# Patient Record
Sex: Male | Born: 1935 | Race: Black or African American | Hispanic: No | Marital: Married | Smoking: Former smoker
Health system: Southern US, Community
[De-identification: ages and names within clinical notes are randomized; demographics above are authoritative.]

## PROBLEM LIST (undated history)

## (undated) DIAGNOSIS — E7800 Pure hypercholesterolemia, unspecified: Secondary | ICD-10-CM

## (undated) DIAGNOSIS — C801 Malignant (primary) neoplasm, unspecified: Secondary | ICD-10-CM

## (undated) DIAGNOSIS — F0390 Unspecified dementia without behavioral disturbance: Secondary | ICD-10-CM

## (undated) DIAGNOSIS — M1990 Unspecified osteoarthritis, unspecified site: Secondary | ICD-10-CM

## (undated) DIAGNOSIS — K8020 Calculus of gallbladder without cholecystitis without obstruction: Secondary | ICD-10-CM

## (undated) DIAGNOSIS — N289 Disorder of kidney and ureter, unspecified: Secondary | ICD-10-CM

## (undated) DIAGNOSIS — I10 Essential (primary) hypertension: Secondary | ICD-10-CM

## (undated) HISTORY — PX: CHOLECYSTECTOMY: SHX55

## (undated) HISTORY — PX: FRACTURE SURGERY: SHX138

---

## 1993-12-12 DIAGNOSIS — C801 Malignant (primary) neoplasm, unspecified: Secondary | ICD-10-CM

## 1993-12-12 HISTORY — DX: Malignant (primary) neoplasm, unspecified: C80.1

## 1994-12-12 HISTORY — PX: PROSTATE SURGERY: SHX751

## 1998-04-21 ENCOUNTER — Ambulatory Visit (HOSPITAL_COMMUNITY): Admission: RE | Admit: 1998-04-21 | Discharge: 1998-04-22 | Payer: Self-pay | Admitting: Urology

## 1998-10-30 ENCOUNTER — Ambulatory Visit (HOSPITAL_COMMUNITY): Admission: RE | Admit: 1998-10-30 | Discharge: 1998-10-30 | Payer: Self-pay | Admitting: Urology

## 2002-01-02 ENCOUNTER — Encounter: Payer: Self-pay | Admitting: Family Medicine

## 2002-01-02 ENCOUNTER — Ambulatory Visit (HOSPITAL_COMMUNITY): Admission: RE | Admit: 2002-01-02 | Discharge: 2002-01-02 | Payer: Self-pay | Admitting: Family Medicine

## 2002-11-11 ENCOUNTER — Ambulatory Visit (HOSPITAL_COMMUNITY): Admission: RE | Admit: 2002-11-11 | Discharge: 2002-11-11 | Payer: Self-pay | Admitting: Ophthalmology

## 2003-11-10 ENCOUNTER — Ambulatory Visit (HOSPITAL_COMMUNITY): Admission: RE | Admit: 2003-11-10 | Discharge: 2003-11-10 | Payer: Self-pay | Admitting: Urology

## 2003-11-27 ENCOUNTER — Observation Stay (HOSPITAL_COMMUNITY): Admission: RE | Admit: 2003-11-27 | Discharge: 2003-11-28 | Payer: Self-pay | Admitting: Urology

## 2004-06-29 ENCOUNTER — Ambulatory Visit (HOSPITAL_COMMUNITY): Admission: RE | Admit: 2004-06-29 | Discharge: 2004-06-29 | Payer: Self-pay | Admitting: Urology

## 2005-05-20 ENCOUNTER — Ambulatory Visit (HOSPITAL_COMMUNITY): Admission: RE | Admit: 2005-05-20 | Discharge: 2005-05-20 | Payer: Self-pay | Admitting: Urology

## 2005-07-18 ENCOUNTER — Ambulatory Visit (HOSPITAL_COMMUNITY): Admission: RE | Admit: 2005-07-18 | Discharge: 2005-07-18 | Payer: Self-pay | Admitting: Internal Medicine

## 2005-07-18 ENCOUNTER — Encounter (INDEPENDENT_AMBULATORY_CARE_PROVIDER_SITE_OTHER): Payer: Self-pay | Admitting: Urology

## 2006-03-29 ENCOUNTER — Ambulatory Visit (HOSPITAL_COMMUNITY): Admission: RE | Admit: 2006-03-29 | Discharge: 2006-03-29 | Payer: Self-pay | Admitting: Internal Medicine

## 2006-10-12 ENCOUNTER — Ambulatory Visit: Payer: Self-pay | Admitting: Orthopedic Surgery

## 2006-10-17 ENCOUNTER — Ambulatory Visit (HOSPITAL_COMMUNITY): Admission: RE | Admit: 2006-10-17 | Discharge: 2006-10-17 | Payer: Self-pay | Admitting: Internal Medicine

## 2006-10-17 ENCOUNTER — Ambulatory Visit: Payer: Self-pay | Admitting: Cardiology

## 2007-01-29 ENCOUNTER — Ambulatory Visit (HOSPITAL_COMMUNITY): Admission: RE | Admit: 2007-01-29 | Discharge: 2007-01-29 | Payer: Self-pay | Admitting: Ophthalmology

## 2007-05-03 ENCOUNTER — Ambulatory Visit (HOSPITAL_COMMUNITY): Admission: RE | Admit: 2007-05-03 | Discharge: 2007-05-03 | Payer: Self-pay | Admitting: Internal Medicine

## 2007-05-11 ENCOUNTER — Ambulatory Visit (HOSPITAL_COMMUNITY): Admission: RE | Admit: 2007-05-11 | Discharge: 2007-05-11 | Payer: Self-pay | Admitting: Urology

## 2007-11-20 ENCOUNTER — Ambulatory Visit (HOSPITAL_COMMUNITY): Admission: RE | Admit: 2007-11-20 | Discharge: 2007-11-20 | Payer: Self-pay | Admitting: Urology

## 2007-11-30 ENCOUNTER — Ambulatory Visit (HOSPITAL_COMMUNITY): Admission: RE | Admit: 2007-11-30 | Discharge: 2007-11-30 | Payer: Self-pay | Admitting: Urology

## 2009-05-21 ENCOUNTER — Ambulatory Visit (HOSPITAL_COMMUNITY): Admission: RE | Admit: 2009-05-21 | Discharge: 2009-05-21 | Payer: Self-pay | Admitting: Urology

## 2010-05-17 ENCOUNTER — Ambulatory Visit (HOSPITAL_COMMUNITY): Admission: RE | Admit: 2010-05-17 | Discharge: 2010-05-17 | Payer: Self-pay | Admitting: Urology

## 2011-04-29 NOTE — H&P (Signed)
NAME:  Walter Goodwin                          ACCOUNT NO.:  [id number]   MEDICAL RECORD NO.:  [id number]                  PATIENT TYPE:   LOCATION:                                       FACILITY:  [facility]   PHYSICIAN:  Dennie Maizes, M.D.                DATE OF BIRTH:  05/23/1917   DATE OF ADMISSION:  11/27/2003  DATE OF DISCHARGE:                                HISTORY & PHYSICAL   CHIEF COMPLAINT:  Dysuria, urinary incontinence, carcinoma of prostate,  history of urinary tract infection.   HISTORY OF PRESENT ILLNESS:  This 77 year old male was referred to me by  Annia Friendly. Loleta Chance, M.D.  He was diagnosed to have carcinoma of the prostate in  1995.  He was treated with brachytherapy at that time.  The patient had  voiding difficulty after that and he underwent transurethral resection of  the prostate in 1996 by Lindaann Slough, M.D.  The patient continued to have  hematuria and urinary symptoms.  I saw him in 1998.  Evaluation revealed  multiple bladder calculi as well as prostatic calcifications.  The patient  underwent litholapaxy and transurethral resection of bladder neck at that  time.  The patient has undergone another procedure by Dr. Brunilda Payor a few years  ago.  He was seen in the office with the complaints of dysuria, urinary  frequency and urinary incontinence.  He has been using a Cunningham clamp  for urinary incontinence.  Urine culture and sensitivity revealed growth of  more than 100,000 colonies of E. coli and he was treated with amoxicillin.  X-ray of the KUB area revealed 9 mm size calculus impacted in the proximal  urethra.  The patient is brought to the day hospital today for cystoscopy  and litholapaxy.   PAST MEDICAL HISTORY:  1. History of cholelithiasis, status post right pyelolithotomy in 1981 at     [place].  2. Status post laparoscopic cholecystectomy.  3. Status post brachytherapy for carcinoma of the prostate in 1995.  4. Status post transurethral resection  of the prostate in 1996.  5. Status post litholapaxy and transurethral resection of bladder neck in     1998.  6. Patient has hyperlipidemia.   MEDICATIONS:  Lopid.   ALLERGIES:  None.   PHYSICAL EXAMINATION:  HEENT:  Normal.  NECK:  No masses.  LUNGS:  Clear to auscultation.  HEART:  Regular rate and rhythm, no murmurs.  ABDOMEN:  Soft, no palpable or frank mass.  No CVA tenderness.  Bladder not  palpable.  GENITOURINARY:  Penis normal.  Testes are normal.  RECTAL:  Small, firm prostate.   IMPRESSIONS:  1. Prostatic calculus.  2. Urinary incontinence.  3. History of urinary tract infection.  4. Carcinoma of the prostate.  5. Status post brachytherapy.   PLAN:  Cystoscopy and litholapaxy of the prostatic calculus under anesthesia  in the hospital.   I have discussed with  the patient and his family regarding the diagnosis,  alternative treatments, operative details, outcome, possible risks and  complications and he has agreed for the procedure to be done.     ___________________________________________                                         Dennie Maizes, M.D.   SK/MEDQ  D:  11/26/2003  T:  11/27/2003  Job:  [id number]   cc:   Annia Friendly. Loleta Chance, M.D.  [address]  [place]  [place] [address]  Fax: [phone]

## 2011-04-29 NOTE — Consult Note (Signed)
NAME:  Walter Goodwin, Walter Goodwin                          ACCOUNT NO.:  [id number]   MEDICAL RECORD NO.:  [id number]                   PATIENT TYPE:  OBV   LOCATION:  A306                                 FACILITY:  [facility]   PHYSICIAN:  Annia Friendly. Loleta Chance, M.D.                DATE OF BIRTH:  Jun 21, 1917   DATE OF CONSULTATION:  11/27/2003  DATE OF DISCHARGE:                                   CONSULTATION   REFERRING PHYSICIAN:  Dr. Dennie Maizes.   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  The patient was admitted by Dr. Rito Ehrlich, urologist, on  November 27, 2003 for cystoscopy and litholapaxy for prostatic calculus  under anesthesia at [facility].  History is positive for complaint  of dysuria, increased frequency of urination and urinary incontinence.  He  denies fever, chills, back pain, nausea and vomiting.  Medical history is  significant for history of kidney stones.  X-ray revealed a 9-mm-size  calculus impacted in the proximal urethra.  Medical history is also positive  for hyperlipidemia, hypertension, prostate cancer and osteoarthritis of  knees.  Medical history is negative for tuberculosis, sickle cell, asthma or  seizure disorder.   PRESCRIBED MEDICATIONS:  1. Lipitor 10 mg p.o. every day.  2. DynaCirc CR 5 mg p.o. every day.  3. Mobic 7.5 mg p.o. every day.   ALLERGIES:  The patient is not allergic to any known medication.   PAST MEDICAL HISTORY:  Past medical history is positive for:  1. Hospitalization in 1981 at [facility] for removal of left     renal stone;  2. Cholecystectomy in 1997.  3. Transurethral resection of the prostate in 1996.  4. The patient also experienced treatment with I-125 seed radioactive     implants for prostate cancer in [place], [place].   REVIEW OF SYSTEMS:  Review of systems is positive  for chronic bilateral knee  pain, chronic urinary dribbling, episodic pain with urination, episodic  gross hematuria, etc.  Review of systems is negative for epistaxis, bleeding  gums, dysphagia, hemoptysis, wheezing, shortness of breath, weight loss,  unexplained fever, night sweats, edema of legs, diarrhea, etc.   FAMILY HISTORY:  Family history revealed mother deceased at age 42 secondary  to cancer, unknown type; father deceased at age 5 secondary to complication  of Alzheimer's disease; two brother deceased in their 98s secondary to  complications of diabetes; two sisters living, age 29, health unknown, and  14, health unknown; one son living at age 40 in good health and one son  deceased at age 60, cause unknown.   HABITS:  Habits are positive for cigarette smoking (less than 1 pack per  day, starting as an adolescent) and positive for former use of ethanol x20  years).   PHYSICAL  EXAMINATION:  VITALS:  Temperature 100.0, pulse 55, respirations  18, blood pressure 120/63.  GENERAL:  General appearance revealed an elderly, medium-framed, medium-  height black male in no apparent respiratory distress.  HEENT:  Head:  Normocephalic.  Ears:  Normal auricles.  Eyes:  Lids negative  for ptosis.  Sclerae are white.  Positive bilateral corneal implants.  Extraocular movements intact.  Nose:  Negative for discharge.  Mouth:  Positive for a few poor teeth at top, no teeth at bottom, no bleeding gum,  no oral lesions.  Posterior pharynx benign.  NECK:  Neck negative for adenopathy or thyromegaly.  LUNGS:  Lungs clear.  HEART:  Audible S1 and S2 without murmur, regular rate and rhythm.  ABDOMEN:  Abdomen obese, hyperactive bowel sounds, soft, nontender in all  four quadrants.  No palpable mass.  No organomegaly.  GENITALIA:  Penis uncircumcised.  Urethral stoma positive for indwelling  Foley catheter.  Scrotum:  Palpable testicles without nodule or tenderness.  RECTAL:  Deferred.   EXTREMITIES:  Lower:  No tibial edema.  Palpable femoral artery and dorsalis  pedis bilaterally.  Knees positive for crepitus, stiffness and tenderness  with flexion and extension bilaterally.  NEUROLOGIC:  Alert and oriented to person, place and time.  Cranial nerves  II-XII appeared intact.   LABORATORIES:  White count 6.2, hemoglobin 14.1, hematocrit 42.6, platelets  259,000; sodium 140, potassium 4.5, chloride 108, CO2 28, glucose 96, BUN  18, creatinine 1.0, calcium 10.0 on November 25, 2003.   IMPRESSION:  Primary prostatic calculus -- status post cystoscopy and  litholapaxy.   SECONDARY DIAGNOSES:  1. Hypertension.  2. Hyperlipidemia.  3. Osteoarthritis.  4. Prostate cancer.   PLAN:  1. IV fluids.  2. IV antibiotics.  3. DynaCirc 5 mg p.o. every day.  4. Analgesia for pain.  5. Watch urine output and intake.  6. Oxygen at 2 L per minute via nasal cannula.  7. Diet -- 4 g sodium.  8. Activity as tolerated.  9. Lipitor 10 mg p.o. every day.  10.      Repeat CBC and MET-7 in a.m.      ___________________________________________                                            Annia Friendly. Loleta Chance, M.D.   Levonne Hubert  D:  11/27/2003  T:  11/28/2003  Job:  [id number]

## 2011-04-29 NOTE — Procedures (Signed)
NAME:  MAYKEL, REITTER                ACCOUNT NO.:  [id number]   MEDICAL RECORD NO.:  [id number]          PATIENT TYPE:  OUT   LOCATION:  RAD                           FACILITY:  [facility]   PHYSICIAN:  Gerrit Friends. Dietrich Pates, MD, FACCDATE OF BIRTH:  1917/02/03   DATE OF PROCEDURE:  10/17/2006  DATE OF DISCHARGE:                                  ECHOCARDIOGRAM   CLINICAL DATA:  An 77 year old gentleman with hypertension.  M-mode aorta  3.7, left atrium 4.4, septum 1.7, posterior wall 1.6, LV diastole 5.0, LV  systole 3.4.   1. Technically suboptimal but adequate echocardiographic study.  2. Mild left and right atrial enlargement.  3. Normal right ventricular size and function; mild RVH.  4. Minimally sclerotic trileaflet aortic valve.  5. Normal mitral, tricuspid and pulmonic valves; mild calcification of the      common annulus.  6. Trivial mitral regurgitation.  7. Normal proximal pulmonary artery.  8. Physiologic pericardial effusion.  9. Normal IVC.  10.Left ventricular size at the upper limit of normal; mild to moderate      hypertrophy; normal regional and global function.      Gerrit Friends. Dietrich Pates, MD, [facility]  Electronically Signed     RMR/MEDQ  D:  10/18/2006  T:  10/18/2006  Job:  [id number]

## 2011-04-29 NOTE — H&P (Signed)
NAME:  Walter Goodwin, Walter Goodwin                ACCOUNT NO.:  [id number]   MEDICAL RECORD NO.:  [id number]          PATIENT TYPE:  AMB   LOCATION:  DAY                           FACILITY:  [facility]   PHYSICIAN:  Dennie Maizes, M.D.   DATE OF BIRTH:  Aug 25, 1917   DATE OF ADMISSION:  07/18/2005  DATE OF DISCHARGE:  LH                                HISTORY & PHYSICAL   CHIEF COMPLAINT:  Recurrent urinary tract infection, voiding difficulty,  ureteral stricture, carcinoma of the prostate.   HISTORY OF PRESENT ILLNESS:  This is an 77 year old male who was diagnosed  with a carcinoma of the prostate in 1995.  He was treated with brachytherapy  at [facility] at that time.  He developed voiding difficulty and he has  undergone transurethral resection of prostate in 1996.  The patient has been  evaluated by me in 2004, with bladder calculus and posterior urethral  stricture.  He has undergone litholapaxy and transurethral incision of the  bladder neck as well as posterior urethral stricture.  He had been doing  well until recently.  He developed left epididymitis and a secondary urinary  tract infections recently.  He also has voiding difficulty and severe degree  of urinary incontinence.  He has been using Cunningham clamp for the urinary  incontinence.  He had been treated with appropriate antibiotics for urinary  tract infection.  He is brought to the [facility] today for  circumcision, cystoscopy, and visual internal urethrotomy.  He has a severe  degree of phimosis.   PAST MEDICAL HISTORY:  1.  History of urolithiasis, status post right pyelolithotomy in 1981 at      [place].  2.  Status post laparoscopic cholecystectomy.  3.  Status post brachytherapy for carcinoma of the prostate in 1995.  4.  Status post TURP in 1996.  5.  Status post litholapaxy and transurethral resection of bladder neck in      1998.  6.  Status post cystoscopy, transurethral resection of urethral stricture      and  bladder neck stenosis, litholapaxy of bladder calculus in 2004.   MEDICATIONS:  Lopid.   ALLERGIES:  None.   PHYSICAL EXAMINATION:  HEENT:  Normal.  NECK:  No masses.  LUNGS:  Clear to auscultation.  HEART:  Regular rate and rhythm, no murmurs.  ABDOMEN:  Soft, no palpable flank masses, CVA tenderness.  Bladder is not  palpable.  GENITOURINARY:  Phimosis is noted.  Testes are normal.  The patient has  severe urinary incontinence and uses a Cunningham clamp.  RECTAL:  Small firm prostate.   IMPRESSION:  1.  Recurrent urinary tract infections.  2.  Phimosis.  3.  Ureteral stricture.  4.  Carcinoma of prostate, status post brachytherapy.   PLAN:  Circumcision, cystoscopy, and visual urethrotomy under anesthesia in  the hospital.  I have discussed with the patient and his family regarding  the diagnosis, operative details, outcome, alternate treatments, possible  risks and complications, and he has agreed for the procedure to be done.  He  will need postoperative catheter drainage for  a week.       SK/MEDQ  D:  07/17/2005  T:  07/17/2005  Job:  [id number]   cc:   Annia Friendly. Loleta Chance, MD  [address]  [place]  [place] [address]  Fax: [phone]

## 2011-04-29 NOTE — Op Note (Signed)
NAME:  Walter Goodwin, Walter Goodwin                          ACCOUNT NO.:  [id number]   MEDICAL RECORD NO.:  [id number]                   PATIENT TYPE:  OBV   LOCATION:  A306                                 FACILITY:  [facility]   PHYSICIAN:  Dennie Maizes, M.D.                DATE OF BIRTH:  07-Sep-1917   DATE OF PROCEDURE:  11/27/2003  DATE OF DISCHARGE:                                 OPERATIVE REPORT   PREOPERATIVE DIAGNOSES:  1. Urinary tract infection.  2. Urethral calculus.  3. Carcinoma of the prostate.   POSTOPERATIVE DIAGNOSES:  1. Ureteral stricture and bladder neck stenosis.  2. Urethral calculus.  3. Carcinoma of the prostate.   PROCEDURE:  Cystoscopy, transurethral incision of urethral stricture and  bladder neck stenosis, litholapaxy of bladder calculus (1.5 cm).   ANESTHESIA:  Spinal.   SURGEON:  Dennie Maizes, M.D.   COMPLICATIONS:  None.   INDICATIONS FOR PROCEDURE:  This is an 77 year old male who had carcinoma of  the prostate and he had been treated with brachytherapy.  He developed a  urinary tract infection recently as well as voiding difficulty and urinary  incontinence.  His x-rays revealed a urethral calculus.  The patient was  brought to the OR today for further evaluation with cystoscopy and  litholapaxy of the urethral calculus.   DESCRIPTION OF PROCEDURE:  Spinal anesthesia was induced, and the patient  was placed on the OR table in the dorsal lithotomy position.  The lower  abdomen and genitalia were prepped and draped in a sterile fashion.   Cystoscopy was done with a 23 [place] scope.  The anterior urethra was  normal.  There was a dense prostatic urethral stricture.  The visual  urethrotome was then introduced into the urethra.  I tried to incise the  prostatic urethral stricture at the 12 o'clock position.  The stricture was  too dense to be incised by the cold knife.  The resectoscope was then  inserted, and incision of the urethral stricture was done at  12 o'clock by a  Collings knife electrode.  The patient also had a severe degree of bladder  neck stenosis which was incised at the 3 and 9 o'clock positions with the  electrode knife.  There was a 1.5-cm stone impacted in the prostatic  urethral which was pushed into the bladder.  The appearance of the bladder  was normal.  The resectoscope was then introduced into the bladder.  With  withdrawing the resectoscope further, incision of the bladder neck was done.  The resectoscope was then removed.  Cystoscopy was repeated with the 23  French sheath.  The stone was then crushed with the manual lithotrite.  Irrigation of the bladder was done with an Ellik evacuator, and all of the  stone fragments were removed.  Examination of the bladder at this time was  normal.  The instruments were removed.  A 20 [place]  Foley catheter was  inserted into the bladder, and the urine was clear.  Estimated blood loss  was minimal.   The patient was transferred to the PACU in satisfactory condition.      ___________________________________________                                            Dennie Maizes, M.D.   SK/MEDQ  D:  11/27/2003  T:  11/28/2003  Job:  [id number]   cc:   Annia Friendly. Loleta Chance, M.D.  [address]  [place]  [place] [address]  Fax: [phone]

## 2011-04-29 NOTE — Op Note (Signed)
NAME:  GREGOR, DERSHEM                ACCOUNT NO.:  [id number]   MEDICAL RECORD NO.:  [id number]          PATIENT TYPE:  AMB   LOCATION:  DAY                           [facility]   PHYSICIAN:  Dennie Maizes, M.D.   DATE OF BIRTH:  1917-10-28   DATE OF PROCEDURE:  07/18/2005  DATE OF DISCHARGE:                                 OPERATIVE REPORT   PREOPERATIVE DIAGNOSES:  Urethral stricture, recurrent urinary tract  infections, carcinoma of prostate, phimosis.   POSTOPERATIVE DIAGNOSIS:  Urethral stricture, recurrent urinary tract  infections, carcinoma of prostate, phimosis.   OPERATIVE PROCEDURE:  Cystoscopy, visual internal urethrotomy and  circumcision.   ANESTHESIA:  Spinal.   SURGEON:  Dr. Rito Ehrlich.   COMPLICATIONS:  None.   ESTIMATED BLOOD LOSS:  Minimal.   DRAINS:  20-French Silastic Foley catheter in the bladder.   INDICATIONS FOR PROCEDURE:  This 77 year old male with carcinoma of prostate  has undergone brachytherapy. After the brachytherapy, he has undergone  transurethral resection of prostate as well as bladder neck in the past. He  was recently evaluated for recurrent urinary tract infections. He had  phimosis and ureteral stricture. He was brought to the OR today for  cystoscopy, visual internal urethrotomy and circumcision.   DESCRIPTION OF PROCEDURE:  Spinal anesthesia was induced, and the patient  was placed on the OR table in the supine position. The lower abdomen and  genitalia were prepped and draped in a sterile fashion. The patient had a  tight phimosis. The foreskin was then clamped at 6 and 12 o'clock positions  with straight hemostats. Dorsal and ventral slits were made. The redundant  foreskin was then excised. Hemostasis was obtained by cauterization. The  edges of the foreskin were then approximated using 4-0 chromic.   The patient was then placed the dorsolithotomy position. Cystoscopy was done  with a 24-French scope. The anterior urethra  was normal. There was distal  prostatic urethral stricture, and the instrument could not be passed into  the bladder. The 20-French visual urethrotome was inserted into the urethra.  The distal prostatic ureteral stricture was then incised. The scope was  introduced into the bladder. The prostatic urethra, proximal prostatic  urethra, and the bladder neck were opened. There was no evidence of any  foreign body or stone in the bladder. Trigone, ureteral orifices and bladder  mucosa were otherwise unremarkable.   While withdrawing the ureterotome, the stricture was reincised and  completely opened. There is no active bleeding. The urethrotome was then  removed. A 20-French Silastic Foley catheter with 10 cc balloon was inserted  into the bladder. Irrigation of the bladder was done, and the returns were  clear. There was no active bleeding. The Foley catheter was connected to  urine bag. Vaseline dressing and Coban were applied to the penis. The  patient was transferred to the PACU in satisfactory condition. The sponges  and instruments were correct at the time of closure of the incision.       SK/MEDQ  D:  07/18/2005  T:  07/18/2005  Job:  [id number]

## 2011-04-29 NOTE — Op Note (Signed)
[place]  Patient:    Walter Goodwin Visit Number: [id number] MRN: [id number]          Service Type: OBV Location: [id number] Attending Physician:  Karenann Cai Admit Date:  3//98/06/0 Discharge Date: 4//98/06/0                             Operative Report  PREOPERATIVE DIAGNOSES: 1.  Chronic glaucoma, right eye. 2.  Chronic uveitis, right eye. 3.  Ocular pain.  POSTOPERATIVE DIAGNOSES: 1.  Chronic glaucoma, right eye. 2.  Chronic uveitis, right eye. 3.  Ocular pain.  OPERATION: 1.  Removal and exchange of intraocular lens, right eye. 2.  Anterior vitrectomy. 3.  Peripheral iridectomy.  ANESTHESIA:  Local using xylocaine 2% and Marcaine 0.75% [place].  SURGEON:  Marya Landry. Carlyle Lipa., M.D.  INDICATIONS:  This is a 77 year old gentleman who underwent a cataract extraction from the right eye several months ago. Surgery at that time was complicated by posterior capsule rupture and vitreous loss.  An anterior chamber lens was then inserted. The patient developed glaucoma which was managed conservatively. He had a Yag laser iridotomy but this was unsuccessful and the iridotomy opening has subsequently closed.  He has maintained persistent iris  ____________ and has had frequent bouts of uveitis and ocular pain.  The decision was made to exchange the implant.  After adequately explaining the pros and cons along with the alternatives and objectives of surgery the patient agreed to have this procedure done.  He is therefore admitted at this time for that purpose.  DESCRIPTION OF PROCEDURE:   Under the influence of IV sedation, deep vein akinesia and retrobulbar anesthesia was given.  The patient was prepped and draped in the usual manner.  A lid speculum was inserted under the upper and lower lid of the right eye.  A 4-0 silk traction suture was passed through the bed of the superior rectus muscle for traction.  A  fornix-based conjunctival flap was turned and hemostasis was achieved by using the cautery. A grooved incision was made around the limbus to 180 degrees.  The anterior chamber was entered through this grooved incision at the 11 oclock position using a Superblade.  Occucoat was injected into the eye.  The wound was then widened to approximately 180 degrees around this grooved incision placing a single 8-0 Vicryl suture across each arm of the incision as it was made respectively toward the left and toward the right.  The lens implant was then gently teased and removed from the anterior chamber. A peripheral iridectomy was made in the eye.  An anterior vitrectomy was then done.  The anterior chamber was reformed and the pupils were constricted using Miochol.  An anterior chamber lens of 13.75 mm in length was then fused across the eye into the chamber angle.  The corneoscleral wound was then closed by using a combination of interrupted sutures of 8-0 Vicryl and 10-0 nylon.  After it was ascertained that the wound was airtight and water tight the conjunctiva was closed using thermocautery.  Celestone 1 cc was gentamicin 0.5 cc were injected subconjunctivally.  Maxitrol ophthalmic ointment and Atropine drops were applied along with a patch and Fox shield.  The patient tolerated the procedure well and was discharged in satisfactory condition to the postanesthesia recovery room.  Because this patient has a variety of medical problems the decision was made, mainly at  his request, to admit him for overnight observation.  He will be discharged tomorrow if his condition is satisfactory.  DISCHARGE DIAGNOSES: 1.  Chronic glaucoma. 2.  Chronic uveitis. 3.  Ocular pain. Attending Physician:  Karenann Cai DD:  05/16/97 TD:  05/16/97 Job: [id number]

## 2011-06-02 ENCOUNTER — Other Ambulatory Visit (HOSPITAL_COMMUNITY): Payer: Self-pay | Admitting: Urology

## 2011-06-02 ENCOUNTER — Ambulatory Visit (HOSPITAL_COMMUNITY)
Admission: RE | Admit: 2011-06-02 | Discharge: 2011-06-02 | Disposition: A | Discharge status: Home / Self Care | Payer: Medicare Other | Source: Ambulatory Visit | Attending: Urology | Admitting: Urology

## 2011-06-02 DIAGNOSIS — C61 Malignant neoplasm of prostate: Secondary | ICD-10-CM | POA: Insufficient documentation

## 2011-06-02 DIAGNOSIS — R109 Unspecified abdominal pain: Secondary | ICD-10-CM | POA: Insufficient documentation

## 2011-06-02 DIAGNOSIS — N210 Calculus in bladder: Secondary | ICD-10-CM

## 2011-12-29 ENCOUNTER — Ambulatory Visit (HOSPITAL_COMMUNITY)
Admission: RE | Admit: 2011-12-29 | Discharge: 2011-12-29 | Disposition: A | Discharge status: Home / Self Care | Payer: Medicare Other | Source: Ambulatory Visit | Attending: Urology | Admitting: Urology

## 2011-12-29 ENCOUNTER — Other Ambulatory Visit (HOSPITAL_COMMUNITY): Payer: Self-pay | Admitting: Urology

## 2011-12-29 DIAGNOSIS — N210 Calculus in bladder: Secondary | ICD-10-CM

## 2015-05-27 DIAGNOSIS — M1990 Unspecified osteoarthritis, unspecified site: Secondary | ICD-10-CM | POA: Diagnosis not present

## 2015-05-27 DIAGNOSIS — N39498 Other specified urinary incontinence: Secondary | ICD-10-CM | POA: Diagnosis not present

## 2015-05-27 DIAGNOSIS — R739 Hyperglycemia, unspecified: Secondary | ICD-10-CM | POA: Diagnosis not present

## 2015-05-27 DIAGNOSIS — E785 Hyperlipidemia, unspecified: Secondary | ICD-10-CM | POA: Diagnosis not present

## 2015-05-27 DIAGNOSIS — I10 Essential (primary) hypertension: Secondary | ICD-10-CM | POA: Diagnosis not present

## 2015-05-27 DIAGNOSIS — K5900 Constipation, unspecified: Secondary | ICD-10-CM | POA: Diagnosis not present

## 2015-09-02 DIAGNOSIS — E785 Hyperlipidemia, unspecified: Secondary | ICD-10-CM | POA: Diagnosis not present

## 2015-09-02 DIAGNOSIS — N39498 Other specified urinary incontinence: Secondary | ICD-10-CM | POA: Diagnosis not present

## 2015-09-02 DIAGNOSIS — I10 Essential (primary) hypertension: Secondary | ICD-10-CM | POA: Diagnosis not present

## 2015-09-02 DIAGNOSIS — M1990 Unspecified osteoarthritis, unspecified site: Secondary | ICD-10-CM | POA: Diagnosis not present

## 2015-09-15 ENCOUNTER — Ambulatory Visit (INDEPENDENT_AMBULATORY_CARE_PROVIDER_SITE_OTHER): Payer: Medicare Other | Admitting: Urology

## 2015-09-15 DIAGNOSIS — R32 Unspecified urinary incontinence: Secondary | ICD-10-CM

## 2015-09-15 DIAGNOSIS — C61 Malignant neoplasm of prostate: Secondary | ICD-10-CM | POA: Diagnosis not present

## 2015-11-30 DIAGNOSIS — I10 Essential (primary) hypertension: Secondary | ICD-10-CM | POA: Diagnosis not present

## 2015-11-30 DIAGNOSIS — N39498 Other specified urinary incontinence: Secondary | ICD-10-CM | POA: Diagnosis not present

## 2015-11-30 DIAGNOSIS — C61 Malignant neoplasm of prostate: Secondary | ICD-10-CM | POA: Diagnosis not present

## 2015-11-30 DIAGNOSIS — M1990 Unspecified osteoarthritis, unspecified site: Secondary | ICD-10-CM | POA: Diagnosis not present

## 2016-02-29 DIAGNOSIS — K5900 Constipation, unspecified: Secondary | ICD-10-CM | POA: Diagnosis not present

## 2016-02-29 DIAGNOSIS — E785 Hyperlipidemia, unspecified: Secondary | ICD-10-CM | POA: Diagnosis not present

## 2016-02-29 DIAGNOSIS — M1990 Unspecified osteoarthritis, unspecified site: Secondary | ICD-10-CM | POA: Diagnosis not present

## 2016-02-29 DIAGNOSIS — I10 Essential (primary) hypertension: Secondary | ICD-10-CM | POA: Diagnosis not present

## 2016-03-03 DIAGNOSIS — N39498 Other specified urinary incontinence: Secondary | ICD-10-CM | POA: Diagnosis not present

## 2016-03-15 ENCOUNTER — Ambulatory Visit (INDEPENDENT_AMBULATORY_CARE_PROVIDER_SITE_OTHER): Payer: Medicare Other | Admitting: Urology

## 2016-03-15 DIAGNOSIS — C61 Malignant neoplasm of prostate: Secondary | ICD-10-CM | POA: Diagnosis not present

## 2016-03-15 DIAGNOSIS — K5900 Constipation, unspecified: Secondary | ICD-10-CM

## 2016-03-15 DIAGNOSIS — R3981 Functional urinary incontinence: Secondary | ICD-10-CM | POA: Diagnosis not present

## 2016-03-23 DIAGNOSIS — N39498 Other specified urinary incontinence: Secondary | ICD-10-CM | POA: Diagnosis not present

## 2016-05-30 DIAGNOSIS — E785 Hyperlipidemia, unspecified: Secondary | ICD-10-CM | POA: Diagnosis not present

## 2016-05-30 DIAGNOSIS — M1990 Unspecified osteoarthritis, unspecified site: Secondary | ICD-10-CM | POA: Diagnosis not present

## 2016-05-30 DIAGNOSIS — I10 Essential (primary) hypertension: Secondary | ICD-10-CM | POA: Diagnosis not present

## 2017-01-17 ENCOUNTER — Other Ambulatory Visit (HOSPITAL_COMMUNITY): Payer: Self-pay | Admitting: Family Medicine

## 2017-01-17 DIAGNOSIS — R9389 Abnormal findings on diagnostic imaging of other specified body structures: Secondary | ICD-10-CM

## 2017-01-26 ENCOUNTER — Ambulatory Visit (HOSPITAL_COMMUNITY)
Admission: RE | Admit: 2017-01-26 | Discharge: 2017-01-26 | Disposition: A | Discharge status: Home / Self Care | Payer: Medicare Other | Source: Ambulatory Visit | Attending: Family Medicine | Admitting: Family Medicine

## 2017-01-26 DIAGNOSIS — R05 Cough: Secondary | ICD-10-CM | POA: Insufficient documentation

## 2017-01-26 DIAGNOSIS — E049 Nontoxic goiter, unspecified: Secondary | ICD-10-CM | POA: Insufficient documentation

## 2017-01-26 DIAGNOSIS — Z87891 Personal history of nicotine dependence: Secondary | ICD-10-CM | POA: Insufficient documentation

## 2017-01-26 DIAGNOSIS — R918 Other nonspecific abnormal finding of lung field: Secondary | ICD-10-CM | POA: Insufficient documentation

## 2017-01-26 DIAGNOSIS — R9389 Abnormal findings on diagnostic imaging of other specified body structures: Secondary | ICD-10-CM

## 2017-01-26 MED ORDER — IOPAMIDOL (ISOVUE-300) INJECTION 61%
75.0000 mL | Freq: Once | INTRAVENOUS | Status: AC | PRN
  Administered 2017-01-26: 75 mL via INTRAVENOUS

## 2017-03-07 DIAGNOSIS — I10 Essential (primary) hypertension: Secondary | ICD-10-CM | POA: Diagnosis not present

## 2017-03-07 DIAGNOSIS — M1990 Unspecified osteoarthritis, unspecified site: Secondary | ICD-10-CM | POA: Diagnosis not present

## 2017-03-07 DIAGNOSIS — Z0000 Encounter for general adult medical examination without abnormal findings: Secondary | ICD-10-CM | POA: Diagnosis not present

## 2017-03-07 DIAGNOSIS — E785 Hyperlipidemia, unspecified: Secondary | ICD-10-CM | POA: Diagnosis not present

## 2017-03-07 DIAGNOSIS — R2681 Unsteadiness on feet: Secondary | ICD-10-CM | POA: Diagnosis not present

## 2017-03-07 DIAGNOSIS — K5900 Constipation, unspecified: Secondary | ICD-10-CM | POA: Diagnosis not present

## 2017-03-07 DIAGNOSIS — E039 Hypothyroidism, unspecified: Secondary | ICD-10-CM | POA: Diagnosis not present

## 2017-03-07 DIAGNOSIS — N402 Nodular prostate without lower urinary tract symptoms: Secondary | ICD-10-CM | POA: Diagnosis not present

## 2017-04-04 ENCOUNTER — Ambulatory Visit (INDEPENDENT_AMBULATORY_CARE_PROVIDER_SITE_OTHER): Payer: Medicare Other | Admitting: Urology

## 2017-04-04 DIAGNOSIS — C61 Malignant neoplasm of prostate: Secondary | ICD-10-CM | POA: Diagnosis not present

## 2017-06-02 DIAGNOSIS — E785 Hyperlipidemia, unspecified: Secondary | ICD-10-CM | POA: Diagnosis not present

## 2017-06-02 DIAGNOSIS — M1990 Unspecified osteoarthritis, unspecified site: Secondary | ICD-10-CM | POA: Diagnosis not present

## 2017-06-02 DIAGNOSIS — I10 Essential (primary) hypertension: Secondary | ICD-10-CM | POA: Diagnosis not present

## 2017-06-25 ENCOUNTER — Emergency Department (HOSPITAL_COMMUNITY): Payer: Medicare Other

## 2017-06-25 ENCOUNTER — Inpatient Hospital Stay (HOSPITAL_COMMUNITY): Payer: Medicare Other

## 2017-06-25 ENCOUNTER — Encounter (HOSPITAL_COMMUNITY): Payer: Self-pay | Admitting: Emergency Medicine

## 2017-06-25 ENCOUNTER — Other Ambulatory Visit: Payer: Self-pay

## 2017-06-25 ENCOUNTER — Inpatient Hospital Stay (HOSPITAL_COMMUNITY)
Admission: EM | Admit: 2017-06-25 | Discharge: 2017-06-28 | DRG: 481 | Disposition: A | Discharge status: Skilled Nursing Facility | Payer: Medicare Other | Attending: Internal Medicine | Admitting: Internal Medicine

## 2017-06-25 DIAGNOSIS — E860 Dehydration: Secondary | ICD-10-CM | POA: Diagnosis present

## 2017-06-25 DIAGNOSIS — Z9049 Acquired absence of other specified parts of digestive tract: Secondary | ICD-10-CM

## 2017-06-25 DIAGNOSIS — E039 Hypothyroidism, unspecified: Secondary | ICD-10-CM | POA: Diagnosis present

## 2017-06-25 DIAGNOSIS — Z9079 Acquired absence of other genital organ(s): Secondary | ICD-10-CM | POA: Diagnosis not present

## 2017-06-25 DIAGNOSIS — Z87891 Personal history of nicotine dependence: Secondary | ICD-10-CM

## 2017-06-25 DIAGNOSIS — R404 Transient alteration of awareness: Secondary | ICD-10-CM | POA: Diagnosis not present

## 2017-06-25 DIAGNOSIS — S72001A Fracture of unspecified part of neck of right femur, initial encounter for closed fracture: Secondary | ICD-10-CM

## 2017-06-25 DIAGNOSIS — I10 Essential (primary) hypertension: Secondary | ICD-10-CM | POA: Diagnosis not present

## 2017-06-25 DIAGNOSIS — N179 Acute kidney failure, unspecified: Secondary | ICD-10-CM | POA: Diagnosis present

## 2017-06-25 DIAGNOSIS — W19XXXA Unspecified fall, initial encounter: Secondary | ICD-10-CM | POA: Diagnosis not present

## 2017-06-25 DIAGNOSIS — S728X9A Other fracture of unspecified femur, initial encounter for closed fracture: Secondary | ICD-10-CM | POA: Diagnosis not present

## 2017-06-25 DIAGNOSIS — M6281 Muscle weakness (generalized): Secondary | ICD-10-CM | POA: Diagnosis not present

## 2017-06-25 DIAGNOSIS — M25561 Pain in right knee: Secondary | ICD-10-CM | POA: Diagnosis present

## 2017-06-25 DIAGNOSIS — E7800 Pure hypercholesterolemia, unspecified: Secondary | ICD-10-CM | POA: Diagnosis not present

## 2017-06-25 DIAGNOSIS — Z419 Encounter for procedure for purposes other than remedying health state, unspecified: Secondary | ICD-10-CM

## 2017-06-25 DIAGNOSIS — Z8781 Personal history of (healed) traumatic fracture: Secondary | ICD-10-CM | POA: Diagnosis not present

## 2017-06-25 DIAGNOSIS — Z8546 Personal history of malignant neoplasm of prostate: Secondary | ICD-10-CM

## 2017-06-25 DIAGNOSIS — S72141A Displaced intertrochanteric fracture of right femur, initial encounter for closed fracture: Secondary | ICD-10-CM | POA: Diagnosis not present

## 2017-06-25 DIAGNOSIS — R279 Unspecified lack of coordination: Secondary | ICD-10-CM | POA: Diagnosis not present

## 2017-06-25 DIAGNOSIS — F0390 Unspecified dementia without behavioral disturbance: Secondary | ICD-10-CM | POA: Diagnosis present

## 2017-06-25 DIAGNOSIS — Y9222 Religious institution as the place of occurrence of the external cause: Secondary | ICD-10-CM

## 2017-06-25 DIAGNOSIS — R55 Syncope and collapse: Secondary | ICD-10-CM | POA: Diagnosis present

## 2017-06-25 DIAGNOSIS — D320 Benign neoplasm of cerebral meninges: Secondary | ICD-10-CM | POA: Diagnosis present

## 2017-06-25 DIAGNOSIS — R531 Weakness: Secondary | ICD-10-CM | POA: Diagnosis not present

## 2017-06-25 DIAGNOSIS — S72091A Other fracture of head and neck of right femur, initial encounter for closed fracture: Secondary | ICD-10-CM | POA: Diagnosis not present

## 2017-06-25 DIAGNOSIS — S72144A Nondisplaced intertrochanteric fracture of right femur, initial encounter for closed fracture: Secondary | ICD-10-CM | POA: Diagnosis not present

## 2017-06-25 DIAGNOSIS — Z4789 Encounter for other orthopedic aftercare: Secondary | ICD-10-CM | POA: Diagnosis not present

## 2017-06-25 DIAGNOSIS — S0990XA Unspecified injury of head, initial encounter: Secondary | ICD-10-CM | POA: Diagnosis not present

## 2017-06-25 DIAGNOSIS — S299XXA Unspecified injury of thorax, initial encounter: Secondary | ICD-10-CM | POA: Diagnosis not present

## 2017-06-25 HISTORY — DX: Malignant (primary) neoplasm, unspecified: C80.1

## 2017-06-25 HISTORY — DX: Unspecified dementia, unspecified severity, without behavioral disturbance, psychotic disturbance, mood disturbance, and anxiety: F03.90

## 2017-06-25 HISTORY — DX: Essential (primary) hypertension: I10

## 2017-06-25 HISTORY — DX: Pure hypercholesterolemia, unspecified: E78.00

## 2017-06-25 HISTORY — DX: Calculus of gallbladder without cholecystitis without obstruction: K80.20

## 2017-06-25 HISTORY — DX: Disorder of kidney and ureter, unspecified: N28.9

## 2017-06-25 HISTORY — DX: Unspecified osteoarthritis, unspecified site: M19.90

## 2017-06-25 LAB — CBC WITH DIFFERENTIAL/PLATELET
BASOS ABS: 0 10*3/uL (ref 0.0–0.1)
Basophils Relative: 0 %
EOS ABS: 0.2 10*3/uL (ref 0.0–0.7)
EOS PCT: 2 %
HCT: 38.1 % — ABNORMAL LOW (ref 39.0–52.0)
Hemoglobin: 12.3 g/dL — ABNORMAL LOW (ref 13.0–17.0)
LYMPHS PCT: 20 %
Lymphs Abs: 1.9 10*3/uL (ref 0.7–4.0)
MCH: 29.5 pg (ref 26.0–34.0)
MCHC: 32.3 g/dL (ref 30.0–36.0)
MCV: 91.4 fL (ref 78.0–100.0)
Monocytes Absolute: 0.9 10*3/uL (ref 0.1–1.0)
Monocytes Relative: 10 %
Neutro Abs: 6 10*3/uL (ref 1.7–7.7)
Neutrophils Relative %: 68 %
PLATELETS: 249 10*3/uL (ref 150–400)
RBC: 4.17 MIL/uL — AB (ref 4.22–5.81)
RDW: 15.9 % — ABNORMAL HIGH (ref 11.5–15.5)
WBC: 9.1 10*3/uL (ref 4.0–10.5)

## 2017-06-25 LAB — BASIC METABOLIC PANEL
Anion gap: 10 (ref 5–15)
BUN: 21 mg/dL — ABNORMAL HIGH (ref 6–20)
CHLORIDE: 106 mmol/L (ref 101–111)
CO2: 28 mmol/L (ref 22–32)
CREATININE: 1.4 mg/dL — AB (ref 0.61–1.24)
Calcium: 9.7 mg/dL (ref 8.9–10.3)
GFR calc non Af Amer: 40 mL/min — ABNORMAL LOW (ref >60)
GFR, EST AFRICAN AMERICAN: 47 mL/min — AB (ref >60)
Glucose, Bld: 110 mg/dL — ABNORMAL HIGH (ref 65–99)
Potassium: 4.6 mmol/L (ref 3.5–5.1)
SODIUM: 144 mmol/L (ref 135–145)

## 2017-06-25 LAB — PROTIME-INR
INR: 1.07
PROTHROMBIN TIME: 13.9 s (ref 11.4–15.2)

## 2017-06-25 LAB — TROPONIN I

## 2017-06-25 MED ORDER — CEFAZOLIN SODIUM-DEXTROSE 2-4 GM/100ML-% IV SOLN
2.0000 g | INTRAVENOUS | Status: AC
  Administered 2017-06-26: 2 g via INTRAVENOUS
  Filled 2017-06-25 (×2): qty 100

## 2017-06-25 MED ORDER — MORPHINE SULFATE (PF) 4 MG/ML IV SOLN: 2.0000 mg | INTRAVENOUS | Status: DC | PRN

## 2017-06-25 MED ORDER — VITAMIN B-12 1000 MCG PO TABS
1000.0000 ug | ORAL_TABLET | Freq: Every day | ORAL | Status: DC
  Administered 2017-06-27 – 2017-06-28 (×2): 1000 ug via ORAL
  Filled 2017-06-25 (×2): qty 1

## 2017-06-25 MED ORDER — METHOCARBAMOL 1000 MG/10ML IJ SOLN
500.0000 mg | Freq: Three times a day (TID) | INTRAVENOUS | Status: DC | PRN
  Filled 2017-06-25: qty 5

## 2017-06-25 MED ORDER — ATORVASTATIN CALCIUM 10 MG PO TABS
10.0000 mg | ORAL_TABLET | Freq: Every day | ORAL | Status: DC
  Administered 2017-06-25 – 2017-06-28 (×3): 10 mg via ORAL
  Filled 2017-06-25 (×3): qty 1

## 2017-06-25 MED ORDER — AMLODIPINE BESYLATE 10 MG PO TABS
10.0000 mg | ORAL_TABLET | Freq: Every day | ORAL | Status: DC
  Administered 2017-06-27 – 2017-06-28 (×2): 10 mg via ORAL
  Filled 2017-06-25 (×2): qty 1

## 2017-06-25 MED ORDER — FENTANYL CITRATE (PF) 100 MCG/2ML IJ SOLN
12.5000 ug | INTRAMUSCULAR | Status: DC | PRN
  Administered 2017-06-25: 12.5 ug via INTRAVENOUS

## 2017-06-25 MED ORDER — POVIDONE-IODINE 10 % EX SWAB
2.0000 "application " | Freq: Once | CUTANEOUS | Status: AC
  Administered 2017-06-26: 2 via TOPICAL

## 2017-06-25 MED ORDER — SODIUM CHLORIDE 0.9 % IV SOLN
INTRAVENOUS | Status: DC
  Administered 2017-06-25: 16:00:00 via INTRAVENOUS

## 2017-06-25 MED ORDER — NAPHAZOLINE-GLYCERIN 0.012-0.2 % OP SOLN
1.0000 [drp] | Freq: Two times a day (BID) | OPHTHALMIC | Status: DC
  Administered 2017-06-27: 2 [drp] via OPHTHALMIC
  Administered 2017-06-27: 1 [drp] via OPHTHALMIC
  Administered 2017-06-28: 2 [drp] via OPHTHALMIC
  Filled 2017-06-25 (×2): qty 15

## 2017-06-25 MED ORDER — ONDANSETRON HCL 4 MG PO TABS: 4.0000 mg | ORAL_TABLET | Freq: Four times a day (QID) | ORAL | Status: DC | PRN

## 2017-06-25 MED ORDER — CHLORHEXIDINE GLUCONATE 4 % EX LIQD
60.0000 mL | Freq: Once | CUTANEOUS | Status: AC
Start: 2017-06-25 — End: 2017-06-26
  Administered 2017-06-26: 4 via TOPICAL
  Filled 2017-06-25: qty 60

## 2017-06-25 MED ORDER — ONDANSETRON HCL 4 MG/2ML IJ SOLN: 4.0000 mg | Freq: Four times a day (QID) | INTRAMUSCULAR | Status: DC | PRN

## 2017-06-25 MED ORDER — NAPHAZOLINE-GLYCERIN 0.012-0.2 % OP SOLN: 1.0000 [drp] | Freq: Two times a day (BID) | OPHTHALMIC | Status: DC

## 2017-06-25 MED ORDER — POTASSIUM CHLORIDE CRYS ER 20 MEQ PO TBCR
20.0000 meq | EXTENDED_RELEASE_TABLET | Freq: Every day | ORAL | Status: DC
  Administered 2017-06-27 – 2017-06-28 (×2): 20 meq via ORAL
  Filled 2017-06-25 (×2): qty 1

## 2017-06-25 MED ORDER — SODIUM CHLORIDE 0.9 % IV SOLN
INTRAVENOUS | Status: AC
  Administered 2017-06-25: 75 mL/h via INTRAVENOUS

## 2017-06-25 MED ORDER — FENTANYL CITRATE (PF) 100 MCG/2ML IJ SOLN
INTRAMUSCULAR | Status: AC
  Filled 2017-06-25: qty 2

## 2017-06-25 MED ORDER — SERTRALINE HCL 100 MG PO TABS
100.0000 mg | ORAL_TABLET | Freq: Every day | ORAL | Status: DC
  Administered 2017-06-25 – 2017-06-28 (×3): 100 mg via ORAL
  Filled 2017-06-25: qty 2
  Filled 2017-06-25 (×2): qty 1

## 2017-06-25 MED ORDER — LEVOTHYROXINE SODIUM 75 MCG PO TABS
150.0000 ug | ORAL_TABLET | Freq: Every day | ORAL | Status: DC
  Administered 2017-06-27 – 2017-06-28 (×2): 150 ug via ORAL
  Filled 2017-06-25 (×2): qty 2

## 2017-06-25 NOTE — ED Triage Notes (Signed)
Patient brought in via EMS. Alert and oriented. Airway patent. Patient c/o right knee pain/soreness after falling today. Patient denies hitting head or LOC. Per patient landed on knee. Patient states pain radiates into hip.

## 2017-06-25 NOTE — Progress Notes (Signed)
Patient arrived from [facility] via [facility]. Hospitalists notified of arrival. Nursing will continue to monitor.

## 2017-06-25 NOTE — Progress Notes (Signed)
Ortho Note  I have reviewed xrays and discussed the patient in detail with Dr. Thurnell Garbe of the [facility] ED.  I have requested transfer of this patient to [facility] under the hospitalist's service for medical management in preparation for surgery tomorrow afternoon for his right hip fracture.  Please make NPO at [facility] and hold any chemical DVT ppx or anti platelets tomorrow.  I will see pt and review surgical recommendations with the patient and family once they have arrived at our facility.   Nicholes Stairs

## 2017-06-25 NOTE — H&P (Signed)
History and Physical    Walter Goodwin LFY:[id number] DOB: 01/04/1917 DOA: 06/25/2017  PCP: Rosita Fire, MD   Patient coming from: Home   Chief Complaint:   HPI: Walter Goodwin is a 83 y.o. male with medical history significant of hypertension, prostate cancer 1995, dementia- per patient's wife. History was obtained from the patient and from the patient's wife as patient could not remember the events of today. Patient has a driver who takes him to and from church. Patient was on his way back from church, trying to get out of the car, when he fell on his right knee. Patient tells me he cannot remember the events he thinks he might have passed out. The driver, coronary to patient's house and told patient's spouse to call 911, which she did. Patient's wife states patient seemed to be unconscious. By the time EMS got there patient was awake. Patient was otherwise in his normal state of health until today. No history of vomiting or diarrhea. Good by mouth intake.   No chest pain, today or previously, no shortness of breath, no history of heart attacks or strokes. Blood unaware if patient has kidney disease, no headache. Patient cannot confirm prodromal symptoms, as he cannot remember events surrounding episode. No alcohol use. He remembers just waking up, and not able to stand.  ED Course: vitals-mildly low temp -97.4, pulse- 53, bp- 111/44, wbc- normal at 9, hgb 12.3, plt 249.  Right hip x-ray shows intertrochanteric fracture. Right knee x-ray shows extensive tricompartmental degenerative changes, and small effusion. ED provider contacted orthopedic surgery at [facility]- Dr. Stann Mainland, recommended transfer to [facility] for hip surgery.  Review of Systems: CONSTITUTIONAL- No Fever, weightloss, night sweat or change in appetite. SKIN- No Rash, colour changes or itching. HEAD- No Headache or dizziness. EYES- No Vision loss, pain, redness, double or blurred vision. RESPIRATORY- No Cough or SOB. GI- No  nausea, vomiting, diarrhoea, constipation, abd pain. URINARY- No Frequency, urgency, straining or dysuria. [facility]- Denies depression or anxiety.  Past Medical History:  Diagnosis Date  . Arthritis   . Cancer [facility]) 1995   prostate  . Dementia   . Gall stones   . High cholesterol   . Hypertension   . Renal disorder     Past Surgical History:  Procedure Laterality Date  . CHOLECYSTECTOMY    . [place]   TURP    reports that he quit smoking about 10 years ago. His smoking use included Cigarettes. He has a 85.00 pack-year smoking history. He has quit using smokeless tobacco. His smokeless tobacco use included Snuff and Chew. He reports that he does not drink alcohol or use drugs.  No Known Allergies  History reviewed. No pertinent family history.  Prior to Admission medications   Medication Sig Start Date End Date Taking? Authorizing Provider  acetaminophen (TYLENOL) 325 MG tablet Take 650 mg by mouth every 6 (six) hours as needed for mild pain.   Yes [provider]  amLODipine (NORVASC) 10 MG tablet Take 10 mg by mouth daily.   Yes [provider]  atorvastatin (LIPITOR) 10 MG tablet Take 10 mg by mouth daily.   Yes [provider]  docusate sodium (COLACE) 100 MG capsule Take 100 mg by mouth 2 (two) times daily.   Yes [provider]  furosemide (LASIX) 40 MG tablet Take 40 mg by mouth daily.   Yes [provider]  levothyroxine (SYNTHROID, LEVOTHROID) 150 MCG tablet Take 150 mcg by  mouth daily before breakfast.   Yes [provider]  potassium chloride SA (K-DUR,KLOR-CON) 20 MEQ tablet Take 20 mEq by mouth daily.   Yes [provider]  sertraline (ZOLOFT) 100 MG tablet Take 100 mg by mouth daily.   Yes [provider]  tetrahydrozoline ([employer] EYE DROPS) 0.05 % ophthalmic solution Place 1-2 drops into both eyes 2 (two) times daily as needed.   Yes [provider]  vitamin B-12  (CYANOCOBALAMIN) 1000 MCG tablet Take 1,000 mcg by mouth daily.   Yes [provider]    Physical Exam: Vitals:   06/25/17 1800 06/25/17 1830 06/25/17 1845 06/25/17 1900  BP: 129/84 102/85  114/80  Pulse: (!) 56     Resp: 17 19 16 17   Temp:      TempSrc:      SpO2: 96%     Weight:      Height:        Constitutional: NAD, calm, comfortable Vitals:   06/25/17 1800 06/25/17 1830 06/25/17 1845 06/25/17 1900  BP: 129/84 102/85  114/80  Pulse: (!) 56     Resp: 17 19 16 17   Temp:      TempSrc:      SpO2: 96%     Weight:      Height:       Eyes: PERRL, lids and conjunctivae normal ENMT: Mucous membranes are moist. Posterior pharynx clear of any exudate or lesions.Normal dentition.  Neck: normal, supple, no masses, no thyromegaly Respiratory: clear to auscultation bilaterally, no wheezing, no crackles. Normal respiratory effort. No accessory muscle use.  Cardiovascular: Regular rate and rhythm, No extremity edema. 2+ pedal pulses.  Abdomen: no tenderness, no masses palpated. No hepatosplenomegaly. Bowel sounds positive.  Musculoskeletal: no clubbing / cyanosis. Bony enlargement of right knee, tender to palpation, reduced range of motion due to pain, no erythema or differential warmth.  Skin: no rashes, lesions, ulcers. No induration Neurologic: CN 2-12 grossly intact. strength in right lower extremity to could not be assessed due to pain. Strength 5 over 5 in other extremities.  Psychiatric: Normal judgment and insight. Alert and oriented x 3. Normal mood.   Labs on Admission: I have personally reviewed following labs and imaging studies  CBC:  Recent Labs Lab 06/25/17 1421  WBC 9.1  NEUTROABS 6.0  HGB 12.3*  HCT 38.1*  MCV 91.4  PLT 778   Basic Metabolic Panel:  Recent Labs Lab 06/25/17 1421  NA 144  K 4.6  CL 106  CO2 28  GLUCOSE 110*  BUN 21*  CREATININE 1.40*  CALCIUM 9.7   Coagulation Profile:  Recent Labs Lab 06/25/17 1421  INR 1.07    Cardiac Enzymes:  Recent Labs Lab 06/25/17 1421  TROPONINI <0.03   Radiological Exams on Admission: Dg Chest 1 View  Result Date: 06/25/2017 CLINICAL DATA:  Fall today EXAM: CHEST 1 VIEW COMPARISON:  01/26/2017 chest CT FINDINGS: Top-normal heart size. Mildly tortuous atherosclerotic thoracic aorta. Otherwise stable mediastinal contour with superior mediastinal widening due to known cord are. No pneumothorax. No pleural effusion. Slightly low lung volumes with vascular crowding, without overt pulmonary edema. No acute consolidative airspace disease. IMPRESSION: No active cardiopulmonary disease. Electronically Signed   By: Ilona Sorrel M.D.   On: 06/25/2017 14:17   Dg Knee Complete 4 Views Right  Result Date: 06/25/2017 CLINICAL DATA:  Right knee pain after a fall today. EXAM: RIGHT KNEE - COMPLETE 4+ VIEW COMPARISON:  None. FINDINGS: AP and lateral views of the  right knee are limited by on difficulty positioning due to a right hip fracture. There are marked tricompartmental degenerative changes. There is a small effusion. No visible fracture or dislocation. Atheromatous arterial calcifications. IMPRESSION: 1. Limited examination with no visible fracture or dislocation. 2. Extensive tricompartmental degenerative changes and small effusion. Electronically Signed   By: Claudie Revering M.D.   On: 06/25/2017 14:16   Dg Hip Unilat W Or Wo Pelvis 2-3 Views Right  Result Date: 06/25/2017 CLINICAL DATA:  Right hip pain following a fall today. EXAM: DG HIP (WITH OR WITHOUT PELVIS) 2-3V RIGHT COMPARISON:  None. FINDINGS: Mildly comminuted right intertrochanteric fracture with mild varus angulation. No significant displacement. Prostate radiation seed implants. Lower lumbar spine degenerative changes. Diffuse osteopenia. IMPRESSION: Right intertrochanteric fracture, as described above. Electronically Signed   By: Claudie Revering M.D.   On: 06/25/2017 14:17   Dg Femur, Min 2 Views Right  Result Date:  06/25/2017 CLINICAL DATA:  Fall today. Intertrochanteric right proximal femur fracture. EXAM: RIGHT FEMUR 2 VIEWS COMPARISON:  Right hip and right knee radiographs from earlier today. FINDINGS: Please note that the proximal 2/3 of the right femur are not imaged in a lateral view due to patient's inability to cooperate due to discomfort. Re- demonstration of mildly impacted intertrochanteric right proximal femur fracture without significant displacement. No additional fracture. No evidence of dislocation at the right hip or right knee. No suspicious focal osseous lesions. Severe tricompartmental right knee osteoarthritis. Moderate right hip osteoarthritis. No radiopaque foreign body. Brachytherapy seeds overlie the location of the prostate. IMPRESSION: Re- demonstration of intertrochanteric right proximal femur fracture. No additional fracture, with limitations as described. Electronically Signed   By: Ilona Sorrel M.D.   On: 06/25/2017 17:46    EKG:no priors to compare, right bundle branch block, left anterior fascicular block, T-wave flattening in II. Assessment/Plan Principal Problem:   Intertrochanteric fracture of right femur (HCC) Active Problems:   Essential hypertension   Knee pain, right   Syncope  Syncope- differentials arrhythmia, ACS, seizure, CVA, valvular disease, orthostatic hypotension considering elevated creatinine- baseline is unknown. Trop 1 negative. EKG-no priors to compare. Chest x-ray unremarkable. No fever or leukocytosis to suggest infection. - Echo - Monitor on telemetry - trop x3  - Ct head stat- moderate atrophic and white matter disease, calcified left frontal meningioma. - EEG - IV fluids normal saline, at 75 mL an hour for 12 hours. ?unknown cardiac and renal status. - EKG in a.m. - Will get UA  Acute or chronic kidney injury- patient is unaware of kidney disease. No dry mucous membranes or extremity edema to indicate fluid status. Med list Lasix 40 mg daily. -  Hold Lasix - Hydrate, sig improvement in creatinine syncope possibly due to dehydration.  Intertrochanteric fracture of right femur- patient will be transferred to [facility] for further care. Dr. Stann Mainland accepted patient.  - Npo midnight. - scds for anticog  Hypertension- blood pressure stable.  - Continue home antihypertensive Norvasc 10 - Hold Lasix 40 daily  Hypothyroidism- continue home Synthroid 150 g daily - Check TSH considering syncope etiology not known.  Meningioma- doubt this is related to patient's syncopal episode considering it is calcified. - Consider for phone or formal neurology consult for recommendations, need for further imaging, If etiology of syncope not identified.  DVT prophylaxis: scds Code Status: full  Family Communication: Significant other present at beside  Disposition Plan:  To be determined Consults called:   orthopedic surgery Admission status:  Inpatient telemetry  Bethena Roys MD [facility] Pager [phone]  If 7PM-7AM, please contact night-coverage [email or web address] Password [facility]  06/25/2017, 7:21 PM

## 2017-06-25 NOTE — ED Notes (Signed)
Transported to xray 

## 2017-06-25 NOTE — ED Provider Notes (Signed)
[facility] DEPT Provider Note   CSN: [id number] Arrival date & time: 06/25/17  1306     History   Chief Complaint Chief Complaint  Patient presents with  . Knee Pain    HPI Walter Goodwin is a 83 y.o. male.  The history is provided by the patient, a relative and the EMS personnel. The history is limited by the condition of the patient (Hx dementia).  Knee Pain      Pt was seen at 1525. Per EMS, pt's family and pt: Pt states he was getting out of the car at church with his walker and fell onto his right knee. Pt c/o right knee and hip pain. Denies hitting head, no LOC, no AMS from baseline, no neck or back pain, no focal motor weakness, no tingling/numbness in extremities.  Past Medical History:  Diagnosis Date  . Arthritis   . Cancer [facility]) 1995   prostate  . Dementia   . Gall stones   . High cholesterol   . Hypertension   . Renal disorder     There are no active problems to display for this patient.   Past Surgical History:  Procedure Laterality Date  . CHOLECYSTECTOMY    . [place]   TURP       Home Medications    Prior to Admission medications   Not on File    Family History History reviewed. No pertinent family history.  Social History Social History  Substance Use Topics  . Smoking status: Former Smoker    Packs/day: 1.00    Years: 85.00    Types: Cigarettes    Quit date: 12/12/2006  . Smokeless tobacco: Former Systems developer    Types: Snuff, Chew  . Alcohol use No     Allergies   Patient has no known allergies.   Review of Systems Review of Systems  Unable to perform ROS: Dementia     Physical Exam Updated Vital Signs BP (!) 111/44 (BP Location: Left Arm)   Pulse (!) 53   Temp (!) 97.4 F (36.3 C) (Oral)   Resp 16   Ht 6' (1.829 m)   Wt 103.4 kg (228 lb)   SpO2 97%   BMI 30.92 kg/m   Physical Exam 1530: Physical examination:  Nursing notes reviewed; Vital signs and O2 SAT reviewed;  Constitutional: Well  developed, Well nourished, Well hydrated, In no acute distress; Head:  Normocephalic, atraumatic; Eyes: EOMI, PERRL, No scleral icterus; ENMT: Mouth and pharynx normal, Mucous membranes moist; Neck: Supple, Full range of motion, No lymphadenopathy; Cardiovascular: Regular rate and rhythm, No gallop; Respiratory: Breath sounds clear & equal bilaterally, No wheezes.  Speaking full sentences with ease, Normal respiratory effort/excursion; Chest: Nontender, Movement normal; Abdomen: Soft, Nontender, Nondistended, Normal bowel sounds; Genitourinary: No CVA tenderness; Extremities: Pulses normal, Pelvis stable. +right hip tenderness to palp. +small superficial abrasion right elbow. No edema, No calf tenderness, edema or asymmetry. NT right knee/ankle/foot.; Neuro: Awake, alert. Major CN grossly intact. No facial droop. Speech clear. No gross focal motor deficits in extremities.; Skin: Color normal, Warm, Dry.   ED Treatments / Results  Labs (all labs ordered are listed, but only abnormal results are displayed)   EKG  EKG Interpretation None       Radiology   Procedures Procedures (including critical care time)  Medications Ordered in ED Medications  fentaNYL (SUBLIMAZE) injection 12.5 mcg (12.5 mcg Intravenous Given 06/25/17 1528)  fentaNYL (SUBLIMAZE) 100 MCG/2ML injection (not administered)  Initial Impression / Assessment and Plan / ED Course  I have reviewed the triage vital signs and the nursing notes.  Pertinent labs & imaging results that were available during my care of the patient were reviewed by me and considered in my medical decision making (see chart for details).  MDM Reviewed: previous chart, nursing note and vitals Reviewed previous: labs and ECG Interpretation: labs, ECG and x-ray   Results for orders placed or performed during the hospital encounter of 49/70/26  Basic metabolic panel  Result Value Ref Range   Sodium 144 135 - 145 mmol/L   Potassium 4.6 3.5 -  5.1 mmol/L   Chloride 106 101 - 111 mmol/L   CO2 28 22 - 32 mmol/L   Glucose, Bld 110 (H) 65 - 99 mg/dL   BUN 21 (H) 6 - 20 mg/dL   Creatinine, Ser 1.40 (H) 0.61 - 1.24 mg/dL   Calcium 9.7 8.9 - 10.3 mg/dL   GFR calc non Af Amer 40 (L) >60 mL/min   GFR calc Af Amer 47 (L) >60 mL/min   Anion gap 10 5 - 15  Troponin I  Result Value Ref Range   Troponin I <0.03 <0.03 ng/mL  CBC with Differential  Result Value Ref Range   WBC 9.1 4.0 - 10.5 K/uL   RBC 4.17 (L) 4.22 - 5.81 MIL/uL   Hemoglobin 12.3 (L) 13.0 - 17.0 g/dL   HCT 38.1 (L) 39.0 - 52.0 %   MCV 91.4 78.0 - 100.0 fL   MCH 29.5 26.0 - 34.0 pg   MCHC 32.3 30.0 - 36.0 g/dL   RDW 15.9 (H) 11.5 - 15.5 %   Platelets 249 150 - 400 K/uL   Neutrophils Relative % 68 %   Neutro Abs 6.0 1.7 - 7.7 K/uL   Lymphocytes Relative 20 %   Lymphs Abs 1.9 0.7 - 4.0 K/uL   Monocytes Relative 10 %   Monocytes Absolute 0.9 0.1 - 1.0 K/uL   Eosinophils Relative 2 %   Eosinophils Absolute 0.2 0.0 - 0.7 K/uL   Basophils Relative 0 %   Basophils Absolute 0.0 0.0 - 0.1 K/uL  Protime-INR  Result Value Ref Range   Prothrombin Time 13.9 11.4 - 15.2 seconds   INR 1.07    Dg Chest 1 View Result Date: 06/25/2017 CLINICAL DATA:  Fall today EXAM: CHEST 1 VIEW COMPARISON:  01/26/2017 chest CT FINDINGS: Top-normal heart size. Mildly tortuous atherosclerotic thoracic aorta. Otherwise stable mediastinal contour with superior mediastinal widening due to known cord are. No pneumothorax. No pleural effusion. Slightly low lung volumes with vascular crowding, without overt pulmonary edema. No acute consolidative airspace disease. IMPRESSION: No active cardiopulmonary disease. Electronically Signed   By: Ilona Sorrel M.D.   On: 06/25/2017 14:17   Dg Knee Complete 4 Views Right Result Date: 06/25/2017 CLINICAL DATA:  Right knee pain after a fall today. EXAM: RIGHT KNEE - COMPLETE 4+ VIEW COMPARISON:  None. FINDINGS: AP and lateral views of the right knee are limited by  on difficulty positioning due to a right hip fracture. There are marked tricompartmental degenerative changes. There is a small effusion. No visible fracture or dislocation. Atheromatous arterial calcifications. IMPRESSION: 1. Limited examination with no visible fracture or dislocation. 2. Extensive tricompartmental degenerative changes and small effusion. Electronically Signed   By: Claudie Revering M.D.   On: 06/25/2017 14:16   Dg Hip Unilat W Or Wo Pelvis 2-3 Views Right Result Date: 06/25/2017 CLINICAL DATA:  Right hip pain following  a fall today. EXAM: DG HIP (WITH OR WITHOUT PELVIS) 2-3V RIGHT COMPARISON:  None. FINDINGS: Mildly comminuted right intertrochanteric fracture with mild varus angulation. No significant displacement. Prostate radiation seed implants. Lower lumbar spine degenerative changes. Diffuse osteopenia. IMPRESSION: Right intertrochanteric fracture, as described above. Electronically Signed   By: Claudie Revering M.D.   On: 06/25/2017 14:17    1650:  Dx and testing d/w pt and family.  Questions answered.  Verb understanding, agreeable to admit. T/C to Ortho Dr. Stann Mainland, case discussed, including:  HPI, pertinent PM/SHx, VS/PE, dx testing, ED course and treatment:  Requests to have [facility] transfer to [facility], [place] to eat now, order NPO after midnight, pt to OR tomorrow afternoon.   1705:  T/C to [facility] Dr. Denton Brick, case discussed, including:  HPI, pertinent PM/SHx, VS/PE, dx testing, ED course and treatment:  Agreeable to transfer to [facility] for admit.    Final Clinical Impressions(s) / ED Diagnoses   Final diagnoses:  Fall    New Prescriptions New Prescriptions   No medications on file      Francine Graven, DO 06/28/17 4580

## 2017-06-26 ENCOUNTER — Inpatient Hospital Stay (HOSPITAL_COMMUNITY): Payer: Medicare Other | Admitting: Certified Registered Nurse Anesthetist

## 2017-06-26 ENCOUNTER — Encounter (HOSPITAL_COMMUNITY): Payer: Self-pay | Admitting: *Deleted

## 2017-06-26 ENCOUNTER — Inpatient Hospital Stay (HOSPITAL_COMMUNITY): Payer: Medicare Other

## 2017-06-26 ENCOUNTER — Encounter (HOSPITAL_COMMUNITY)
Admission: EM | Disposition: A | Discharge status: Skilled Nursing Facility | Payer: Self-pay | Source: Home / Self Care | Attending: Family Medicine

## 2017-06-26 ENCOUNTER — Ambulatory Visit (HOSPITAL_COMMUNITY): Payer: Self-pay

## 2017-06-26 DIAGNOSIS — R55 Syncope and collapse: Secondary | ICD-10-CM

## 2017-06-26 DIAGNOSIS — S72141A Displaced intertrochanteric fracture of right femur, initial encounter for closed fracture: Principal | ICD-10-CM

## 2017-06-26 HISTORY — PX: INTRAMEDULLARY (IM) NAIL INTERTROCHANTERIC: SHX5875

## 2017-06-26 LAB — ECHOCARDIOGRAM COMPLETE
Height: 72 in
Weight: 3648 oz

## 2017-06-26 LAB — SURGICAL PCR SCREEN
MRSA, PCR: NEGATIVE
STAPHYLOCOCCUS AUREUS: NEGATIVE

## 2017-06-26 LAB — GLUCOSE, CAPILLARY: GLUCOSE-CAPILLARY: 122 mg/dL — AB (ref 65–99)

## 2017-06-26 SURGERY — FIXATION, FRACTURE, INTERTROCHANTERIC, WITH INTRAMEDULLARY ROD
Anesthesia: General | Site: Hip | Laterality: Right

## 2017-06-26 MED ORDER — ROCURONIUM BROMIDE 10 MG/ML (PF) SYRINGE
PREFILLED_SYRINGE | INTRAVENOUS | Status: DC | PRN
  Administered 2017-06-26: 50 mg via INTRAVENOUS

## 2017-06-26 MED ORDER — METHOCARBAMOL 1000 MG/10ML IJ SOLN
500.0000 mg | Freq: Four times a day (QID) | INTRAVENOUS | Status: DC | PRN
  Filled 2017-06-26: qty 5

## 2017-06-26 MED ORDER — METOCLOPRAMIDE HCL 5 MG PO TABS: 5.0000 mg | ORAL_TABLET | Freq: Three times a day (TID) | ORAL | Status: DC | PRN

## 2017-06-26 MED ORDER — FENTANYL CITRATE (PF) 100 MCG/2ML IJ SOLN
INTRAMUSCULAR | Status: DC | PRN
  Administered 2017-06-26: 100 ug via INTRAVENOUS
  Administered 2017-06-26 (×2): 25 ug via INTRAVENOUS

## 2017-06-26 MED ORDER — ASPIRIN EC 81 MG PO TBEC: 81.0000 mg | DELAYED_RELEASE_TABLET | Freq: Two times a day (BID) | ORAL | 0 refills | Status: AC

## 2017-06-26 MED ORDER — ACETAMINOPHEN 650 MG RE SUPP: 650.0000 mg | Freq: Four times a day (QID) | RECTAL | Status: DC | PRN

## 2017-06-26 MED ORDER — ACETAMINOPHEN 325 MG PO TABS: 650.0000 mg | ORAL_TABLET | Freq: Four times a day (QID) | ORAL | Status: DC | PRN

## 2017-06-26 MED ORDER — TRAMADOL HCL 50 MG PO TABS
50.0000 mg | ORAL_TABLET | Freq: Four times a day (QID) | ORAL | Status: DC | PRN
  Administered 2017-06-26 – 2017-06-28 (×4): 50 mg via ORAL
  Filled 2017-06-26 (×4): qty 1

## 2017-06-26 MED ORDER — METOCLOPRAMIDE HCL 5 MG/ML IJ SOLN: 5.0000 mg | Freq: Three times a day (TID) | INTRAMUSCULAR | Status: DC | PRN

## 2017-06-26 MED ORDER — TRAMADOL HCL 50 MG PO TABS: 50.0000 mg | ORAL_TABLET | Freq: Four times a day (QID) | ORAL | 0 refills | Status: DC | PRN

## 2017-06-26 MED ORDER — LACTATED RINGERS IV SOLN
INTRAVENOUS | Status: DC
  Administered 2017-06-26 (×2): via INTRAVENOUS

## 2017-06-26 MED ORDER — FENTANYL CITRATE (PF) 100 MCG/2ML IJ SOLN
25.0000 ug | INTRAMUSCULAR | Status: DC | PRN
  Administered 2017-06-26: 25 ug via INTRAVENOUS

## 2017-06-26 MED ORDER — EPHEDRINE SULFATE-NACL 50-0.9 MG/10ML-% IV SOSY
PREFILLED_SYRINGE | INTRAVENOUS | Status: DC | PRN
  Administered 2017-06-26 (×2): 5 mg via INTRAVENOUS
  Administered 2017-06-26: 2.5 mg via INTRAVENOUS
  Administered 2017-06-26: 5 mg via INTRAVENOUS
  Administered 2017-06-26: 7.5 mg via INTRAVENOUS

## 2017-06-26 MED ORDER — 0.9 % SODIUM CHLORIDE (POUR BTL) OPTIME
TOPICAL | Status: DC | PRN
  Administered 2017-06-26: 1000 mL

## 2017-06-26 MED ORDER — FENTANYL CITRATE (PF) 250 MCG/5ML IJ SOLN
INTRAMUSCULAR | Status: AC
  Filled 2017-06-26: qty 5

## 2017-06-26 MED ORDER — ONDANSETRON HCL 4 MG PO TABS: 4.0000 mg | ORAL_TABLET | Freq: Four times a day (QID) | ORAL | Status: DC | PRN

## 2017-06-26 MED ORDER — ONDANSETRON HCL 4 MG/2ML IJ SOLN: 4.0000 mg | Freq: Four times a day (QID) | INTRAMUSCULAR | Status: DC | PRN

## 2017-06-26 MED ORDER — ONDANSETRON HCL 4 MG/2ML IJ SOLN
INTRAMUSCULAR | Status: DC | PRN
  Administered 2017-06-26: 4 mg via INTRAVENOUS

## 2017-06-26 MED ORDER — MIDAZOLAM HCL 2 MG/2ML IJ SOLN
INTRAMUSCULAR | Status: AC
  Filled 2017-06-26: qty 2

## 2017-06-26 MED ORDER — CEFAZOLIN SODIUM-DEXTROSE 1-4 GM/50ML-% IV SOLN
1.0000 g | Freq: Four times a day (QID) | INTRAVENOUS | Status: AC
  Administered 2017-06-26 – 2017-06-27 (×3): 1 g via INTRAVENOUS
  Filled 2017-06-26 (×3): qty 50

## 2017-06-26 MED ORDER — METHOCARBAMOL 500 MG PO TABS
500.0000 mg | ORAL_TABLET | Freq: Four times a day (QID) | ORAL | Status: DC | PRN
  Administered 2017-06-26 – 2017-06-28 (×4): 500 mg via ORAL
  Filled 2017-06-26 (×4): qty 1

## 2017-06-26 MED ORDER — FENTANYL CITRATE (PF) 100 MCG/2ML IJ SOLN
INTRAMUSCULAR | Status: AC
  Filled 2017-06-26: qty 2

## 2017-06-26 MED ORDER — LIDOCAINE 2% (20 MG/ML) 5 ML SYRINGE
INTRAMUSCULAR | Status: DC | PRN
  Administered 2017-06-26: 40 mg via INTRAVENOUS

## 2017-06-26 MED ORDER — MORPHINE SULFATE (PF) 4 MG/ML IV SOLN
1.0000 mg | INTRAVENOUS | Status: DC | PRN
  Administered 2017-06-26: 1 mg via INTRAVENOUS
  Filled 2017-06-26: qty 1

## 2017-06-26 MED ORDER — SUGAMMADEX SODIUM 200 MG/2ML IV SOLN
INTRAVENOUS | Status: DC | PRN
  Administered 2017-06-26 (×5): 50 mg via INTRAVENOUS

## 2017-06-26 MED ORDER — PROPOFOL 10 MG/ML IV BOLUS
INTRAVENOUS | Status: AC
  Filled 2017-06-26: qty 40

## 2017-06-26 MED ORDER — PROPOFOL 10 MG/ML IV BOLUS
INTRAVENOUS | Status: DC | PRN
  Administered 2017-06-26 (×2): 20 mg via INTRAVENOUS
  Administered 2017-06-26: 130 mg via INTRAVENOUS

## 2017-06-26 SURGICAL SUPPLY — 50 items
BIT DRILL FLUTED FEMUR 4.2/3 (BIT) ×2 IMPLANT
BLADE SURG 15 STRL LF DISP TIS (BLADE) ×1 IMPLANT
BLADE SURG 15 STRL SS (BLADE) ×3
BNDG COHESIVE 4X5 TAN NS LF (GAUZE/BANDAGES/DRESSINGS) ×1 IMPLANT
BNDG COHESIVE 6X5 TAN STRL LF (GAUZE/BANDAGES/DRESSINGS) ×2 IMPLANT
BNDG GAUZE ELAST 4 BULKY (GAUZE/BANDAGES/DRESSINGS) ×1 IMPLANT
COVER PERINEAL POST (MISCELLANEOUS) ×3 IMPLANT
COVER SURGICAL LIGHT HANDLE (MISCELLANEOUS) ×3 IMPLANT
DRAPE HALF SHEET 40X57 (DRAPES) IMPLANT
DRAPE STERI IOBAN 125X83 (DRAPES) ×3 IMPLANT
DRSG EMULSION OIL 3X3 NADH (GAUZE/BANDAGES/DRESSINGS) ×2 IMPLANT
DRSG MEPILEX BORDER 4X4 (GAUZE/BANDAGES/DRESSINGS) ×1 IMPLANT
DRSG MEPILEX BORDER 4X8 (GAUZE/BANDAGES/DRESSINGS) ×1 IMPLANT
DRSG PAD ABDOMINAL 8X10 ST (GAUZE/BANDAGES/DRESSINGS) ×2 IMPLANT
DURAPREP 26ML APPLICATOR (WOUND CARE) ×3 IMPLANT
ELECT CAUTERY BLADE 6.4 (BLADE) ×3 IMPLANT
ELECT REM PT RETURN 9FT ADLT (ELECTROSURGICAL) ×3
ELECTRODE REM PT RTRN 9FT ADLT (ELECTROSURGICAL) ×1 IMPLANT
FACESHIELD WRAPAROUND (MASK) IMPLANT
FACESHIELD WRAPAROUND OR TEAM (MASK) ×1 IMPLANT
GAUZE SPONGE 4X4 16PLY XRAY LF (GAUZE/BANDAGES/DRESSINGS) ×2 IMPLANT
GAUZE XEROFORM 5X9 LF (GAUZE/BANDAGES/DRESSINGS) ×1 IMPLANT
GLOVE BIO SURGEON STRL SZ7.5 (GLOVE) ×3 IMPLANT
GLOVE BIOGEL PI IND STRL 8 (GLOVE) ×1 IMPLANT
GLOVE BIOGEL PI INDICATOR 8 (GLOVE) ×2
GOWN STRL REUS W/ TWL LRG LVL3 (GOWN DISPOSABLE) ×2 IMPLANT
GOWN STRL REUS W/ TWL XL LVL3 (GOWN DISPOSABLE) ×1 IMPLANT
GOWN STRL REUS W/TWL LRG LVL3 (GOWN DISPOSABLE) ×6
GOWN STRL REUS W/TWL XL LVL3 (GOWN DISPOSABLE) ×3
GUIDEWIRE 3.2X400 (WIRE) ×4 IMPLANT
KIT BASIN OR (CUSTOM PROCEDURE TRAY) ×3 IMPLANT
KIT ROOM TURNOVER OR (KITS) ×3 IMPLANT
LINER BOOT UNIVERSAL DISP (MISCELLANEOUS) ×1 IMPLANT
MANIFOLD NEPTUNE II (INSTRUMENTS) ×1 IMPLANT
NAIL TROCH FIX 10X235 RT 130 (Nail) ×2 IMPLANT
NS IRRIG 1000ML POUR BTL (IV SOLUTION) ×3 IMPLANT
PACK GENERAL/GYN (CUSTOM PROCEDURE TRAY) ×3 IMPLANT
PAD ARMBOARD 7.5X6 YLW CONV (MISCELLANEOUS) ×6 IMPLANT
PAD CAST 4YDX4 CTTN HI CHSV (CAST SUPPLIES) ×2 IMPLANT
PADDING CAST COTTON 4X4 STRL (CAST SUPPLIES)
SCREW LOCK T25 FT 36X5X4.3X (Screw) IMPLANT
SCREW LOCKING 5.0X36MM (Screw) ×3 IMPLANT
SCREW TNFA 110MM HIP (Screw) ×2 IMPLANT
STAPLER VISISTAT 35W (STAPLE) ×3 IMPLANT
SUT MON AB 2-0 CT1 36 (SUTURE) ×1 IMPLANT
SUT VIC AB 2-0 CT1 27 (SUTURE) ×3
SUT VIC AB 2-0 CT1 TAPERPNT 27 (SUTURE) IMPLANT
TOWEL OR 17X24 6PK STRL BLUE (TOWEL DISPOSABLE) ×1 IMPLANT
TOWEL OR 17X26 10 PK STRL BLUE (TOWEL DISPOSABLE) ×1 IMPLANT
WATER STERILE IRR 1000ML POUR (IV SOLUTION) ×3 IMPLANT

## 2017-06-26 NOTE — Progress Notes (Addendum)
[facility]  PROGRESS NOTE  Walter Goodwin WNU:[id number] DOB: [birth year] DOA: 06/25/2017 PCP: Rosita Fire, MD   Brief HPI:   83 y.o. male with medical history significant of hypertension, prostate cancer 1995, dementia- per patient's wife. History was obtained from the patient and from the patient's wife as patient could not remember the events of today. Patient has a driver who takes him to and from church. Patient was on his way back from church, trying to get out of the car, when he fell on his right knee. Patient tells me he cannot remember the events he thinks he might have passed out.    Subjective   Patient seen and examined, complains of mild pain. No chest pain or shortness of breath. Patient had a syncopal episode yesterday leading to fall and hip fracture.   Assessment/Plan:     1. Intertrochanteric fracture of right femur- patient is on schedule for surgery today. Orthopedics following. 2. Syncope- workup for syncope underway, echocardiogram has been ordered. CT head showed moderate atrophy and white matter disease, calcified left frontal meningioma. Will check orthostatic vital signs every shift. EEG has been ordered. Continue monitoring on telemetry.Cardiac enzymes were negative in ED. He does not complain of chest pain. 3. Acute kidney injury- likely from dehydration, Lasix currently hold. Will check BMP in a.m. 4. Hypertension- continue amlodipine, blood pressure is well controlled. 5. Hypothyroidism- continue Synthroid.     DVT prophylaxis: SCD's  Code Status: Full code  Family communication- no family present at bedside   Disposition Plan: [place]   Procedures None  Continuous infusions .  ceFAZolin (ANCEF) IV    . lactated ringers 10 mL/hr at 06/26/17 1002  . [MAR Hold] methocarbamol (ROBAXIN)  IV        Antibiotics:   Anti-infectives    Start     Dose/Rate Route Frequency Ordered Stop    06/26/17 0730  ceFAZolin (ANCEF) IVPB 2g/100 mL premix     2 g 200 mL/hr over 30 Minutes Intravenous To Short Stay 06/25/17 2147 06/27/17 0730       Objective   Vitals:   06/25/17 2000 06/25/17 2015 06/25/17 2148 06/26/17 0603  BP: (!) 149/60  (!) 160/60 (!) 138/56  Pulse: 61 64 68 60  Resp: (!) 25 (!) 25 18 16   Temp:   98.6 F (37 C) 98 F (36.7 C)  TempSrc:   Oral Oral  SpO2: 98% 99% 97% 98%  Weight:      Height:        Intake/Output Summary (Last 24 hours) at 06/26/17 1114 Last data filed at 06/25/17 2357  Gross per 24 hour  Intake            43.75 ml  Output                0 ml  Net            43.75 ml   Filed Weights   06/25/17 1322  Weight: 103.4 kg (228 lb)     Physical Examination:   Physical Exam: Eyes: No icterus, extraocular muscles intact  Mouth: Oral mucosa is moist, no lesions on palate,  Neck: Supple, no deformities, masses, or tenderness Lungs: Normal respiratory effort, bilateral clear to auscultation, no crackles or wheezes.  Heart: Regular rate and rhythm, S1 and S2 normal, no murmurs, rubs auscultated Abdomen: BS normoactive,soft,nondistended,non-tender to palpation,no organomegaly Extremities: No pretibial edema, no erythema, no cyanosis, no clubbing Neuro :  Alert and oriented to time, place and person, No focal deficits Skin: No rashes seen on exam    Data Reviewed: I have personally reviewed following labs and imaging studies  CBG: No results for input(s): GLUCAP in the last 168 hours.  CBC:  Recent Labs Lab 06/25/17 1421  WBC 9.1  NEUTROABS 6.0  HGB 12.3*  HCT 38.1*  MCV 91.4  PLT 937    Basic Metabolic Panel:  Recent Labs Lab 06/25/17 1421  NA 144  K 4.6  CL 106  CO2 28  GLUCOSE 110*  BUN 21*  CREATININE 1.40*  CALCIUM 9.7    No results found for this or any previous visit (from the past 240 hour(s)).   Liver Function Tests: No results for input(s): AST, ALT, ALKPHOS, BILITOT, PROT, ALBUMIN in the last  168 hours. No results for input(s): LIPASE, AMYLASE in the last 168 hours. No results for input(s): AMMONIA in the last 168 hours.  Cardiac Enzymes:  Recent Labs Lab 06/25/17 1421  TROPONINI <0.03   BNP (last 3 results) No results for input(s): BNP in the last 8760 hours.  ProBNP (last 3 results) No results for input(s): PROBNP in the last 8760 hours.    Studies: Dg Chest 1 View  Result Date: 06/25/2017 CLINICAL DATA:  Fall today EXAM: CHEST 1 VIEW COMPARISON:  01/26/2017 chest CT FINDINGS: Top-normal heart size. Mildly tortuous atherosclerotic thoracic aorta. Otherwise stable mediastinal contour with superior mediastinal widening due to known cord are. No pneumothorax. No pleural effusion. Slightly low lung volumes with vascular crowding, without overt pulmonary edema. No acute consolidative airspace disease. IMPRESSION: No active cardiopulmonary disease. Electronically Signed   By: Ilona Sorrel M.D.   On: 06/25/2017 14:17   Ct Head Wo Contrast  Result Date: 06/25/2017 CLINICAL DATA:  Syncopal episode.  Fall.  Landed on knee. EXAM: CT HEAD WITHOUT CONTRAST TECHNIQUE: Contiguous axial images were obtained from the base of the skull through the vertex without intravenous contrast. COMPARISON:  None. FINDINGS: Brain: Moderate generalized atrophy and white matter disease is present bilaterally. The basal ganglia are intact. Insular ribbon is normal bilaterally. The brainstem and cerebellum are within normal limits. A calcified meningioma is noted adjacent to the left frontal lobe measuring 14 x 15 x 10 mm. Vascular: Atherosclerotic calcifications are present within the cavernous internal carotid artery's bilaterally. There is no hyperdense vessel. Skull: The calvarium is intact. No focal lytic or blastic lesions are present. Sinuses/Orbits: The paranasal sinuses and mastoid air cells are clear. IMPRESSION: 1. Moderate atrophy and white matter disease. 2. No acute intracranial abnormality. 3.  15 mm calcified left frontal meningioma Electronically Signed   By: San Morelle M.D.   On: 06/25/2017 21:01   Dg Knee Complete 4 Views Right  Result Date: 06/25/2017 CLINICAL DATA:  Right knee pain after a fall today. EXAM: RIGHT KNEE - COMPLETE 4+ VIEW COMPARISON:  None. FINDINGS: AP and lateral views of the right knee are limited by on difficulty positioning due to a right hip fracture. There are marked tricompartmental degenerative changes. There is a small effusion. No visible fracture or dislocation. Atheromatous arterial calcifications. IMPRESSION: 1. Limited examination with no visible fracture or dislocation. 2. Extensive tricompartmental degenerative changes and small effusion. Electronically Signed   By: Claudie Revering M.D.   On: 06/25/2017 14:16   Dg Hip Unilat W Or Wo Pelvis 2-3 Views Right  Result Date: 06/25/2017 CLINICAL DATA:  Right hip pain following a fall today. EXAM: DG HIP (WITH OR  WITHOUT PELVIS) 2-3V RIGHT COMPARISON:  None. FINDINGS: Mildly comminuted right intertrochanteric fracture with mild varus angulation. No significant displacement. Prostate radiation seed implants. Lower lumbar spine degenerative changes. Diffuse osteopenia. IMPRESSION: Right intertrochanteric fracture, as described above. Electronically Signed   By: Claudie Revering M.D.   On: 06/25/2017 14:17   Dg Femur, Min 2 Views Right  Result Date: 06/25/2017 CLINICAL DATA:  Fall today. Intertrochanteric right proximal femur fracture. EXAM: RIGHT FEMUR 2 VIEWS COMPARISON:  Right hip and right knee radiographs from earlier today. FINDINGS: Please note that the proximal 2/3 of the right femur are not imaged in a lateral view due to patient's inability to cooperate due to discomfort. Re- demonstration of mildly impacted intertrochanteric right proximal femur fracture without significant displacement. No additional fracture. No evidence of dislocation at the right hip or right knee. No suspicious focal osseous  lesions. Severe tricompartmental right knee osteoarthritis. Moderate right hip osteoarthritis. No radiopaque foreign body. Brachytherapy seeds overlie the location of the prostate. IMPRESSION: Re- demonstration of intertrochanteric right proximal femur fracture. No additional fracture, with limitations as described. Electronically Signed   By: Ilona Sorrel M.D.   On: 06/25/2017 17:46    Scheduled Meds: . [MAR Hold] amLODipine  10 mg Oral Daily  . [MAR Hold] atorvastatin  10 mg Oral Daily  . [MAR Hold] levothyroxine  150 mcg Oral QAC breakfast  . [MAR Hold] naphazoline-glycerin  1-2 drop Both Eyes BID  . [MAR Hold] potassium chloride SA  20 mEq Oral Daily  . [MAR Hold] sertraline  100 mg Oral Daily  . [MAR Hold] vitamin B-12  1,000 mcg Oral Daily      Time spent: 25 min  [facility] Pager [phone]. If 7PM-7AM, please contact night-coverage at [email or web address], Office  [phone]  password [email or web address]  06/26/2017, 11:14 AM  LOS: 1 day

## 2017-06-26 NOTE — Progress Notes (Signed)
PT Cancellation Note  Patient Details Name: Walter Goodwin MRN: [id number] DOB: 1917-08-12   Cancelled Treatment:    Reason Eval/Treat Not Completed: Medical issues which prohibited therapy (pt on strict bedrest awaiting sx today. will plan to see next date as appropriate)   Lexxie Winberg B Drake Wuertz 06/26/2017, 7:20 AM  Elwyn Reach, [place]

## 2017-06-26 NOTE — Progress Notes (Signed)
ECHO lab called  No answer will try again left message on their answering machine

## 2017-06-26 NOTE — Transfer of Care (Signed)
Immediate Anesthesia Transfer of Care Note  Patient: Walter Goodwin  Procedure(s) Performed: Procedure(s): INTRAMEDULLARY (IM) NAIL INTERTROCHANTRIC (Right)  Patient Location: PACU  Anesthesia Type:General  Level of Consciousness: pateint uncooperative and confused  Airway & Oxygen Therapy: Patient Spontanous Breathing and Patient connected to face mask oxygen  Post-op Assessment: Report given to RN and Post -op Vital signs reviewed and stable  Post vital signs: Reviewed and stable  Last Vitals:  Vitals:   06/25/17 2148 06/26/17 0603  BP: (!) 160/60 (!) 138/56  Pulse: 68 60  Resp: 18 16  Temp: 37 C 36.7 C    Last Pain:  Vitals:   06/26/17 0603  TempSrc: Oral  PainSc:          Complications: No apparent anesthesia complications

## 2017-06-26 NOTE — Progress Notes (Signed)
Hospitalist paged about patient  Dr. Darrick Meigs returned page said that he saw patient this morning and asked if I could call the ECHO lab to have the ECHO completed

## 2017-06-26 NOTE — Anesthesia Preprocedure Evaluation (Addendum)
Anesthesia Evaluation  Patient identified by MRN, date of birth, ID band Patient awake and Patient confused    Reviewed: Allergy & Precautions, NPO status , Patient's Chart, lab work & pertinent test results  History of Anesthesia Complications Negative for: history of anesthetic complications  Airway Mallampati: II  TM Distance: >3 FB Neck ROM: Full    Dental  (+) Edentulous Upper, Edentulous Lower   Pulmonary former smoker (quit 2008),    breath sounds clear to auscultation       Cardiovascular hypertension, Pt. on medications  Rhythm:Regular Rate:Normal  ECHO today: prelim results EF >60%, valves OK   Neuro/Psych PSYCHIATRIC DISORDERS (mild dementia)    GI/Hepatic negative GI ROS, Neg liver ROS,   Endo/Other  Hyperthyroidism   Renal/GU Renal InsufficiencyRenal disease (creat 1.4)     Musculoskeletal  (+) Arthritis , Osteoarthritis,    Abdominal   Peds  Hematology negative hematology ROS (+)   Anesthesia Other Findings   Reproductive/Obstetrics                            Anesthesia Physical Anesthesia Plan  ASA: III  Anesthesia Plan: General   Post-op Pain Management:    Induction: Intravenous  PONV Risk Score and Plan: 2 and Ondansetron and Dexamethasone  Airway Management Planned: Oral ETT  Additional Equipment:   Intra-op Plan:   Post-operative Plan: Extubation in OR  Informed Consent: I have reviewed the patients History and Physical, chart, labs and discussed the procedure including the risks, benefits and alternatives for the proposed anesthesia with the patient or authorized representative who has indicated his/her understanding and acceptance.   Consent reviewed with POA  Plan Discussed with: CRNA and Surgeon  Anesthesia Plan Comments: (Plan routine monitors, GETA Discussed with pt's wife)       Anesthesia Quick Evaluation

## 2017-06-26 NOTE — Anesthesia Procedure Notes (Signed)
Procedure Name: Intubation Date/Time: 06/26/2017 1:29 PM Performed by: Merdis Delay Pre-anesthesia Checklist: Patient identified, Emergency Drugs available, Suction available, Patient being monitored and Timeout performed Patient Re-evaluated:Patient Re-evaluated prior to induction Oxygen Delivery Method: Circle system utilized Preoxygenation: Pre-oxygenation with 100% oxygen Induction Type: IV induction Ventilation: Mask ventilation with difficulty Laryngoscope Size: Mac and 4 Grade View: Grade I Tube type: Oral Tube size: 8.0 mm Number of attempts: 1 Airway Equipment and Method: Stylet Placement Confirmation: ETT inserted through vocal cords under direct vision,  positive ETCO2 and breath sounds checked- equal and bilateral Tube secured with: Tape Dental Injury: Teeth and Oropharynx as per pre-operative assessment

## 2017-06-26 NOTE — Progress Notes (Signed)
Echo should be here soon to complete the ECHO

## 2017-06-26 NOTE — Consult Note (Addendum)
ORTHOPAEDIC CONSULTATION  REQUESTING PHYSICIAN: Oswald Hillock, MD  PCP:  Rosita Fire, MD  Chief Complaint: Right hip pain following a fall.  HPI: Walter Goodwin is a 83 y.o. male who complains of right hip pain. He sustained a mechanical fall yesterday when he got tripped up by his walker. He was evaluated in the [facility] emergency department in [place]. There he was found to have a right intertrochanteric hip fracture. I was consultative for potential management. I recommended transfer down to the [facility] for definitive operative management.  The patient is independent with his ADLs and lives with his wife independently. He does use the walker for mobilization. He currently denies any numbness or tingling or motor deficits in the right lower extremity. He states his wife and family will be coming down later today as they stated back in [place] last night.  PMHx significant for mild dementia per wife, HTN and prostate cancer in 1995.  Past Medical History:  Diagnosis Date  . Arthritis   . Cancer [facility]) 1995   prostate  . Dementia   . Gall stones   . High cholesterol   . Hypertension   . Renal disorder    Past Surgical History:  Procedure Laterality Date  . CHOLECYSTECTOMY    . PROSTATE SURGERY  1996   TURP   Social History   Social History  . Marital status: Married    Spouse name: N/A  . Number of children: N/A  . Years of education: N/A   Social History Main Topics  . Smoking status: Former Smoker    Packs/day: 1.00    Years: 85.00    Types: Cigarettes    Quit date: 12/12/2006  . Smokeless tobacco: Former Systems developer    Types: Snuff, Chew  . Alcohol use No  . Drug use: No  . Sexual activity: Not Asked   Other Topics Concern  . None   Social History Narrative  . None   History reviewed. No pertinent family history. No Known Allergies Prior to Admission medications   Medication Sig Start Date End Date Taking? Authorizing Provider    acetaminophen (TYLENOL) 325 MG tablet Take 650 mg by mouth every 6 (six) hours as needed for mild pain.   Yes [provider]  amLODipine (NORVASC) 10 MG tablet Take 10 mg by mouth daily.   Yes [provider]  atorvastatin (LIPITOR) 10 MG tablet Take 10 mg by mouth daily.   Yes [provider]  docusate sodium (COLACE) 100 MG capsule Take 100 mg by mouth 2 (two) times daily.   Yes [provider]  furosemide (LASIX) 40 MG tablet Take 40 mg by mouth daily.   Yes [provider]  levothyroxine (SYNTHROID, LEVOTHROID) 150 MCG tablet Take 150 mcg by mouth daily before breakfast.   Yes [provider]  potassium chloride SA (K-DUR,KLOR-CON) 20 MEQ tablet Take 20 mEq by mouth daily.   Yes [provider]  sertraline (ZOLOFT) 100 MG tablet Take 100 mg by mouth daily.   Yes [provider]  tetrahydrozoline ([employer] EYE DROPS) 0.05 % ophthalmic solution Place 1-2 drops into both eyes 2 (two) times daily as needed.   Yes [provider]  vitamin B-12 (CYANOCOBALAMIN) 1000 MCG tablet Take 1,000 mcg by mouth daily.   Yes [provider]   Dg Chest 1 View  Result Date: 06/25/2017 CLINICAL DATA:  Fall today EXAM: CHEST 1 VIEW COMPARISON:  01/26/2017 chest CT FINDINGS: Top-normal  heart size. Mildly tortuous atherosclerotic thoracic aorta. Otherwise stable mediastinal contour with superior mediastinal widening due to known cord are. No pneumothorax. No pleural effusion. Slightly low lung volumes with vascular crowding, without overt pulmonary edema. No acute consolidative airspace disease. IMPRESSION: No active cardiopulmonary disease. Electronically Signed   By: Ilona Sorrel M.D.   On: 06/25/2017 14:17   Ct Head Wo Contrast  Result Date: 06/25/2017 CLINICAL DATA:  Syncopal episode.  Fall.  Landed on knee. EXAM: CT HEAD WITHOUT CONTRAST TECHNIQUE: Contiguous axial images were obtained from the base of the skull through the  vertex without intravenous contrast. COMPARISON:  None. FINDINGS: Brain: Moderate generalized atrophy and white matter disease is present bilaterally. The basal ganglia are intact. Insular ribbon is normal bilaterally. The brainstem and cerebellum are within normal limits. A calcified meningioma is noted adjacent to the left frontal lobe measuring 14 x 15 x 10 mm. Vascular: Atherosclerotic calcifications are present within the cavernous internal carotid artery's bilaterally. There is no hyperdense vessel. Skull: The calvarium is intact. No focal lytic or blastic lesions are present. Sinuses/Orbits: The paranasal sinuses and mastoid air cells are clear. IMPRESSION: 1. Moderate atrophy and white matter disease. 2. No acute intracranial abnormality. 3. 15 mm calcified left frontal meningioma Electronically Signed   By: San Morelle M.D.   On: 06/25/2017 21:01   Dg Knee Complete 4 Views Right  Result Date: 06/25/2017 CLINICAL DATA:  Right knee pain after a fall today. EXAM: RIGHT KNEE - COMPLETE 4+ VIEW COMPARISON:  None. FINDINGS: AP and lateral views of the right knee are limited by on difficulty positioning due to a right hip fracture. There are marked tricompartmental degenerative changes. There is a small effusion. No visible fracture or dislocation. Atheromatous arterial calcifications. IMPRESSION: 1. Limited examination with no visible fracture or dislocation. 2. Extensive tricompartmental degenerative changes and small effusion. Electronically Signed   By: Claudie Revering M.D.   On: 06/25/2017 14:16   Dg Hip Unilat W Or Wo Pelvis 2-3 Views Right  Result Date: 06/25/2017 CLINICAL DATA:  Right hip pain following a fall today. EXAM: DG HIP (WITH OR WITHOUT PELVIS) 2-3V RIGHT COMPARISON:  None. FINDINGS: Mildly comminuted right intertrochanteric fracture with mild varus angulation. No significant displacement. Prostate radiation seed implants. Lower lumbar spine degenerative changes. Diffuse osteopenia.  IMPRESSION: Right intertrochanteric fracture, as described above. Electronically Signed   By: Claudie Revering M.D.   On: 06/25/2017 14:17   Dg Femur, Min 2 Views Right  Result Date: 06/25/2017 CLINICAL DATA:  Fall today. Intertrochanteric right proximal femur fracture. EXAM: RIGHT FEMUR 2 VIEWS COMPARISON:  Right hip and right knee radiographs from earlier today. FINDINGS: Please note that the proximal 2/3 of the right femur are not imaged in a lateral view due to patient's inability to cooperate due to discomfort. Re- demonstration of mildly impacted intertrochanteric right proximal femur fracture without significant displacement. No additional fracture. No evidence of dislocation at the right hip or right knee. No suspicious focal osseous lesions. Severe tricompartmental right knee osteoarthritis. Moderate right hip osteoarthritis. No radiopaque foreign body. Brachytherapy seeds overlie the location of the prostate. IMPRESSION: Re- demonstration of intertrochanteric right proximal femur fracture. No additional fracture, with limitations as described. Electronically Signed   By: Ilona Sorrel M.D.   On: 06/25/2017 17:46    Positive ROS: All other systems have been reviewed and were otherwise negative with the exception of those mentioned in the HPI and as above.  Physical Exam: General: Alert, no acute  distress Cardiovascular: Mild pedal edema Respiratory: No cyanosis, no use of accessory musculature GI: No organomegaly, abdomen is soft and non-tender Skin: No lesions in the area of chief complaint Neurologic: Sensation intact distally Psychiatric: normal mood and affect Lymphatic: No axillary or cervical lymphadenopathy  MUSCULOSKELETAL:  Right lower extremity: The leg is held and shortened position as well as being externally rotated. Does have some tenderness to palpation around the lateral trochanter. Otherwise distally endorses sensation intact to light touch in the deep superficial peroneal  nerve as well as the tibial nerve. He has voluntary motor intact with tib ant/gastrocsoleus/FHL/EHL. 2+ dorsalis pedis pulse.   Assessment: Right hip intertrochanteric fracture.   Plan: -A condition for the patient is for operative management of this fracture today. We will plan on intramedullary nail for right hip fracture. -Please keep nothing by mouth and hold chemical DVT prophylaxis. -The risks, benefits, and alternatives were discussed with the patient and his wife. There are risks associated with the surgery including, but not limited to, problems with anesthesia (death), infection, differences in leg length/angulation/rotation, fracture of bones, loosening or failure of implants, malunion, nonunion, hematoma (blood accumulation) which may require surgical drainage, blood clots, pulmonary embolism, nerve injury (foot drop), and blood vessel injury. The patient and his wife understand these risks and elects to proceed. -He will return to the medicine service postoperatively for perioperative management. -Plan will be for weightbearing as tolerated postoperatively    Nicholes Stairs, MD Cell [phone]    06/26/2017 7:10 AM

## 2017-06-26 NOTE — Anesthesia Postprocedure Evaluation (Signed)
Anesthesia Post Note  Patient: Walter Goodwin  Procedure(s) Performed: Procedure(s) (LRB): INTRAMEDULLARY (IM) NAIL INTERTROCHANTRIC (Right)     Patient location during evaluation: PACU Anesthesia Type: General Level of consciousness: sedated, patient cooperative and confused Pain management: pain level controlled Vital Signs Assessment: post-procedure vital signs reviewed and stable Respiratory status: spontaneous breathing, nonlabored ventilation, respiratory function stable and patient connected to nasal cannula oxygen Cardiovascular status: blood pressure returned to baseline and stable Postop Assessment: no signs of nausea or vomiting Anesthetic complications: no    Last Vitals:  Vitals:   06/26/17 1537 06/26/17 1603  BP: 137/74 (!) 126/58  Pulse: 72 68  Resp: 18 18  Temp:  (!) 36.2 C    Last Pain:  Vitals:   06/26/17 1629  TempSrc:   PainSc: Asleep                 [name],E. Cornel Werber

## 2017-06-26 NOTE — Op Note (Signed)
Date of Surgery: 06/26/2017  INDICATIONS: Mr. Jowett is a 83 y.o.-year-old male who sustained a right hip fracture.  He is a pleasant individual that lives independently with his wife. He is independent with all ADLs and does use a walker at baseline. Per his wife he does have some mild dementia but is very lucid on my interview with him preoperatively. She states that he sustained a fall yesterday and a questionable syncopal event prior to that fall. He was seen at that the [facility] emergency department [place] and found to have a right intertrochanteric hip fracture. I requested that he was transferred down to [facility] for definitive management. The risks and benefits of the procedure discussed with the patient and And his wife prior to the procedure and all questions were answered; consent was obtained.  We discussed the risk of bleeding, infection, damage to neurovascular structures, malunion, nonunion, hardware failure, continued postoperative pain, need for further surgery, blood clots, perioperative mortality, and risk of anesthesia.  PREOPERATIVE DIAGNOSIS: right hip fracture   POSTOPERATIVE DIAGNOSIS: Same   PROCEDURE: Treatment of intertrochanteric, pertrochanteric, subtrochanteric fracture with intramedullary implant. CPT [phone]   SURGEON: Dannielle Karvonen. Stann Mainland, M.D.   ANESTHESIA: general   IV FLUIDS AND URINE: See anesthesia record   ESTIMATED BLOOD LOSS: 150 cc  IMPLANTS:  Synthes TFNA nail 39mm x 235 mm 110 mm compression screw  5.0 x 36 mm interlocking screw  DRAINS: None.   COMPLICATIONS: None.   DESCRIPTION OF PROCEDURE: The patient was brought to the operating room and placed supine on the operating table. The patient's leg had been signed prior to the procedure. The patient had the anesthesia placed by the anesthesiologist. The prep verification and incision time-outs were performed to confirm that this was the correct patient, site, side and location. The patient  had an SCD on the opposite lower extremity. The patient did receive antibiotics prior to the incision and was re-dosed during the procedure as needed at indicated intervals. The patient was positioned on the fracture table with the table in traction and internal rotation to reduce the hip. The well leg was placed in a scissor position and all bony prominences were well-padded. The patient had the lower extremity prepped and draped in the standard surgical fashion. The incision was made 4 finger breadths superior to the greater trochanter. A guide pin was inserted into the tip of the greater trochanter under fluoroscopic guidance. An opening reamer was used to gain access to the femoral canal. The nail length was measured and inserted down the femoral canal to its proper depth. The appropriate version of insertion for the lag screw was found under fluoroscopy. A pin was inserted up the femoral neck through the jig.  The length of the lag screw was then measured. The lag screw was inserted as near to center-center in the head as possible.  an interdigitating compression screw was placed along the compression screw handle. The leg was taken out of traction, then the interdigitating compression screw was used to compress across the fracture. Compression was visualized on serial xrays. Next turned our attention to the distal interlocking screw. Using the guide sleeve through the handle for the nail this was placed percutaneously. A small incision was made overlying the interlocking screw nail site. A tonsil was used to spread down through fascia to the level of the bone. The drill guide was inserted down against the bone and drilled across bicortically. Length of the screw was measured off  of the drill guide. A 36 m screw was placed under hand power into the distal interlocking position.  The nail insertion guide was removed.   The wound was copiously irrigated with saline and the subcutaneous layer closed with 2.0  vicryl and the skin was reapproximated with staples. The wounds were cleaned and dried a final time and a sterile dressing was placed. The hip was taken through a range of motion at the end of the case under fluoroscopic imaging to visualize the approach-withdraw phenomenon and confirm implant length in the head. The patient was then awakened from anesthesia and taken to the recovery room in stable condition. All counts were correct at the end of the case.   POSTOPERATIVE PLAN: The patient will be weight bearing as tolerated and will return in 2 weeks for staple removal and the patient will receive DVT prophylaxis based on other medications, activity level, and risk ratio of bleeding to thrombosis. Our recommendation will be for 81 mg aspirin twice a day for 4 weeks.   Geralynn Rile, [place] [phone] 2:34 PM

## 2017-06-26 NOTE — Discharge Instructions (Signed)
-  Okay for full weightbearing as tolerated to the right lower extremity. Without precautions. -Use assistive devices with ambulation as directed by physical therapist. -It is okay to shower with her postoperative dressing in place. If that bandage become saturated it should be changed daily with a dry clean dressing. Do not submerge under water until you have been seen by her surgeon in 2 weeks. -For prevention of blood clots take one 81 mg aspirin twice daily for 4 weeks. -Return to the [facility] orthopedics office in 2 weeks for staple removal.

## 2017-06-26 NOTE — Progress Notes (Signed)
  Echocardiogram 2D Echocardiogram has been performed.  Walter Goodwin 06/26/2017, 12:43 PM

## 2017-06-26 NOTE — Brief Op Note (Signed)
06/25/2017 - 06/26/2017  2:33 PM  PATIENT:  Marlane Hatcher Matto  83 y.o. male  PRE-OPERATIVE DIAGNOSIS:  RIGHT HIP FRACTURE  POST-OPERATIVE DIAGNOSIS:  RIGHT HIP FRACTURE  PROCEDURE:  Procedure(s): INTRAMEDULLARY (IM) NAIL INTERTROCHANTRIC (Right)  SURGEON:  Surgeon(s) and Role:    * Angellee Cohill, Elly Modena, MD - Primary  PHYSICIAN ASSISTANT:   ASSISTANTS: none   ANESTHESIA:   general  EBL:  Total I/O In: 900 [I.V.:900] Out: 950 [Urine:750; Blood:200]  BLOOD ADMINISTERED:none  DRAINS: none   LOCAL MEDICATIONS USED:  NONE  SPECIMEN:  No Specimen  DISPOSITION OF SPECIMEN:  N/A  COUNTS:  YES  TOURNIQUET:  * No tourniquets in log *  DICTATION: .Note written in EPIC  PLAN OF CARE: Admit to inpatient   PATIENT DISPOSITION:  PACU - hemodynamically stable.   Delay start of Pharmacological VTE agent (>24hrs) due to surgical blood loss or risk of bleeding: not applicable

## 2017-06-27 ENCOUNTER — Encounter (HOSPITAL_COMMUNITY): Payer: Self-pay | Admitting: Orthopedic Surgery

## 2017-06-27 ENCOUNTER — Inpatient Hospital Stay (HOSPITAL_COMMUNITY): Payer: Medicare Other

## 2017-06-27 DIAGNOSIS — R55 Syncope and collapse: Secondary | ICD-10-CM

## 2017-06-27 LAB — GLUCOSE, CAPILLARY
GLUCOSE-CAPILLARY: 110 mg/dL — AB (ref 65–99)
GLUCOSE-CAPILLARY: 100 mg/dL — AB (ref 65–99)
Glucose-Capillary: 121 mg/dL — ABNORMAL HIGH (ref 65–99)
Glucose-Capillary: 111 mg/dL — ABNORMAL HIGH (ref 65–99)

## 2017-06-27 NOTE — Progress Notes (Signed)
   Subjective:  Patient reports pain as no pain at this time.  Denies numbness or tingling.  Feeling tired at this time.  Objective:   VITALS:   Vitals:   06/26/17 1549 06/26/17 1603 06/26/17 2100 06/27/17 0514  BP:  (!) 126/58 (!) 132/53 (!) 130/52  Pulse:  68 73 68  Resp:  18 18 20   Temp:  (!) 97.1 F (36.2 C) 99 F (37.2 C) 98.7 F (37.1 C)  TempSrc:  Oral Oral Oral  SpO2: 93% 92% 97% 94%  Weight:      Height:       RLE Dressings to right leg c/d/i, no drainage Distally + motor TA/GSC/FHL/EHL +SILT sp/dp/sur/saph/tib n 1 + DP  Lab Results  Component Value Date   WBC 9.1 06/25/2017   HGB 12.3 (L) 06/25/2017   HCT 38.1 (L) 06/25/2017   MCV 91.4 06/25/2017   PLT 249 06/25/2017   BMET    Component Value Date/Time   NA 144 06/25/2017 1421   K 4.6 06/25/2017 1421   CL 106 06/25/2017 1421   CO2 28 06/25/2017 1421   GLUCOSE 110 (H) 06/25/2017 1421   BUN 21 (H) 06/25/2017 1421   CREATININE 1.40 (H) 06/25/2017 1421   CALCIUM 9.7 06/25/2017 1421   GFRNONAA 40 (L) 06/25/2017 1421   GFRAA 47 (L) 06/25/2017 1421     Assessment/Plan: 1 Day Post-Op   Principal Problem:   Intertrochanteric fracture of right femur (HCC) Active Problems:   Essential hypertension   Knee pain, right   Syncope   Up with therapy WBAT RLE scds with bid asa for DVT ppx   Nicholes Stairs 06/27/2017, 12:13 PM   Geralynn Rile, MD [phone]

## 2017-06-27 NOTE — Evaluation (Signed)
Occupational Therapy Evaluation Patient Details Name: Walter Goodwin MRN: [id number] DOB: 10/31/1917 Today's Date: 06/27/2017    History of Present Illness Patient is a 83 y/o male who presents with right hip fx s/p fall, now s/p IM nail RLE. PMH includes dementia, HTN, high cholesterol, ca, renal disorder.   Clinical Impression   PTA, pt was living with his wife and son and was performing his own ADLs. Pt currently requiring Mod A for ADLs and Max A+2 for bed mobility and functional transfers. Answered pt and family questions and provided education in preparation for dc possibly today. All OT needs may be met at next venue. Recommend dc to SNF for further OT to increase pt safety and independence with ADLs and functional mobility prior to transitioning home.     Follow Up Recommendations  SNF    Equipment Recommendations  Other (comment) (Defer to next venue)    Recommendations for Other Services PT consult     Precautions / Restrictions Precautions Precautions: Fall Restrictions Weight Bearing Restrictions: Yes RLE Weight Bearing: Weight bearing as tolerated      Mobility Bed Mobility Overal bed mobility: Needs Assistance Bed Mobility: Supine to Sit;Sit to Supine     Supine to sit: Max assist;+2 for physical assistance;HOB elevated Sit to supine: Max assist;+2 for physical assistance   General bed mobility comments: Assist with RLE, trunk and to scoot bottom to EOB. Increased time and cues. Assist of 2 to return to supine.   Transfers Overall transfer level: Needs assistance Equipment used: Rolling walker (2 wheeled) Transfers: Sit to/from Stand Sit to Stand: Max assist;+2 physical assistance;From elevated surface         General transfer comment: Assist of 2 to power to standing with pt pulling up on RW (how he does at home); Cues for upright posture and hip extension. Able to shuffle right foot towards HOB x2 steps.    Balance Overall balance assessment: Needs  assistance Sitting-balance support: Feet supported;No upper extremity supported Sitting balance-Leahy Scale: Fair Sitting balance - Comments: Able to reach down and adjust socks with increased time. 2/4 DOE with this task. Sp02 85% on RA.    Standing balance support: During functional activity;Bilateral upper extremity supported Standing balance-Leahy Scale: Poor Standing balance comment: Reliant on UEs for support and external assist from therapists to stand.                           ADL either performed or assessed with clinical judgement   ADL Overall ADL's : Needs assistance/impaired Eating/Feeding: Set up;Sitting   Grooming: Wash/dry face;Set up;Sitting   Upper Body Bathing: Sitting;Minimal assistance   Lower Body Bathing: Moderate assistance;Sit to/from stand;Sitting/lateral leans Lower Body Bathing Details (indicate cue type and reason): Max A +2 for sit<>stand Upper Body Dressing : Sitting;Minimal assistance Upper Body Dressing Details (indicate cue type and reason): Donned new gown sitting EOB Lower Body Dressing: Moderate assistance;Sit to/from stand;Sitting/lateral leans Lower Body Dressing Details (indicate cue type and reason): Pt demosntrated good ROM to reach and adjust socks. Max A +2 for sit<>stand             Functional mobility during ADLs: Maximal assistance;+2 for physical assistance;+2 for safety/equipment;Cueing for sequencing;Rolling walker General ADL Comments: Pt requiring Max A +2 for mobility and Min-Mod A for ADLs. Pt requiring repeated cues to follow commands. Very pleasant individual and feel he will progress well with time     Vision  Perception     Praxis      Pertinent Vitals/Pain Pain Assessment: Faces Faces Pain Scale: Hurts a little bit Pain Location: right hip with mobility Pain Descriptors / Indicators: Operative site guarding;Guarding Pain Intervention(s): Monitored during session     Hand Dominance Right    Extremity/Trunk Assessment Upper Extremity Assessment Upper Extremity Assessment: Overall WFL for tasks assessed   Lower Extremity Assessment Lower Extremity Assessment: Defer to PT evaluation RLE Deficits / Details: Limited AROM/strength secondary to post op; not able to perform LAQ. RLE Sensation: decreased light touch   Cervical / Trunk Assessment Cervical / Trunk Assessment: Normal   Communication Communication Communication: No difficulties   Cognition Arousal/Alertness: Awake/alert Behavior During Therapy: WFL for tasks assessed/performed Overall Cognitive Status: History of cognitive impairments - at baseline                                 General Comments: Needs repetition of cues.   General Comments  Family present during session. Sp02 ranged from 85-92% on RA.     Exercises     Shoulder Instructions      Home Living Family/patient expects to be discharged to:: Skilled nursing facility Living Arrangements: Spouse/significant other;Children (Lives with wife and son) Available Help at Discharge: Family;Available 24 hours/day Type of Home: House Home Access: Level entry     Home Layout: One level               Home Equipment: Walker - 2 wheels;Walker - 4 wheels          Prior Functioning/Environment Level of Independence: Independent with assistive device(s)  Gait / Transfers Assistance Needed: Uses RW vs rollator PTA.  ADL's / Homemaking Assistance Needed: Pt performed ADLs. Family helps with IADLs.            OT Problem List: Decreased strength;Decreased range of motion;Decreased activity tolerance;Impaired balance (sitting and/or standing);Decreased safety awareness;Decreased knowledge of use of DME or AE;Decreased knowledge of precautions;Pain      OT Treatment/Interventions:      OT Goals(Current goals can be found in the care plan section) Acute Rehab OT Goals Patient Stated Goal: to go to rehab and get stronger OT Goal  Formulation: With patient Time For Goal Achievement: 07/11/17 Potential to Achieve Goals: Good  OT Frequency:     Barriers to D/C:            Co-evaluation PT/OT/SLP Co-Evaluation/Treatment: Yes Reason for Co-Treatment: For patient/therapist safety PT goals addressed during session: Mobility/safety with mobility OT goals addressed during session: ADL's and self-care      AM-PAC PT "6 Clicks" Daily Activity     Outcome Measure Help from another person eating meals?: None Help from another person taking care of personal grooming?: A Little Help from another person toileting, which includes using toliet, bedpan, or urinal?: A Lot Help from another person bathing (including washing, rinsing, drying)?: A Lot Help from another person to put on and taking off regular upper body clothing?: A Little Help from another person to put on and taking off regular lower body clothing?: A Lot 6 Click Score: 16   End of Session Equipment Utilized During Treatment: Gait belt;Rolling walker Nurse Communication: Mobility status;Other (comment) (Condom cath off)  Activity Tolerance: Patient tolerated treatment well Patient left: in bed;with call bell/phone within reach;with family/visitor present  OT Visit Diagnosis: Unsteadiness on feet (R26.81);Other abnormalities of gait and mobility (R26.89);Muscle weakness (generalized) (M62.81);Pain;Other  symptoms and signs involving cognitive function Pain - Right/Left: Right Pain - part of body: Leg                Time: 1352-1417 OT Time Calculation (min): 25 min Charges:  OT General Charges $OT Visit: 1 Procedure OT Evaluation $OT Eval Moderate Complexity: 1 Procedure G-Codes:     [place], OTR/L Acute Rehab Pager: [phone] Office: [place] 06/27/2017, 4:00 PM

## 2017-06-27 NOTE — Procedures (Signed)
ELECTROENCEPHALOGRAM REPORT  Date of Study: 06/27/2017  Patient's Name: Walter Goodwin MRN: [id number] Date of Birth: Aug 18, 1917  Referring Provider: Dr. Jenetta Downer  Clinical History: This is a 83 year old man with syncope.  Medications: acetaminophen (TYLENOL) tablet 650 mg  amLODipine (NORVASC) tablet 10 mg  atorvastatin (LIPITOR) tablet 10 mg  lactated ringers infusion  levothyroxine (SYNTHROID, LEVOTHROID) tablet 150 mcg  methocarbamol (ROBAXIN) 500 mg in dextrose 5 % 50 mL IVPB  sertraline (ZOLOFT) tablet 100 mg  traMADol (ULTRAM) tablet 50-100 mg  vitamin B-12 (CYANOCOBALAMIN) tablet 1,000 mcg   Technical Summary: A multichannel digital EEG recording measured by the international 10-20 system with electrodes applied with paste and impedances below 5000 ohms performed in our laboratory with EKG monitoring in an awake and drowsy patient.  Hyperventilation and photic stimulation were not performed.  The digital EEG was referentially recorded, reformatted, and digitally filtered in a variety of bipolar and referential montages for optimal display.    Description: The patient is awake and drowsy during the recording.  During maximal wakefulness, there is a symmetric, medium voltage 8 Hz posterior dominant rhythm that attenuates with eye opening.  The record is symmetric.  During drowsiness and sleep, there is an increase in theta and delta slowing of the background.  Deeper stages of sleep were not seen. Hyperventilation and photic stimulation were not performed. There were no epileptiform discharges or electrographic seizures seen.    EKG lead was unremarkable.  Impression: This awake and drowsy EEG is normal.    Clinical Correlation: A normal EEG does not exclude a clinical diagnosis of epilepsy. Clinical correlation is advised.   Ellouise Newer, M.D.

## 2017-06-27 NOTE — Progress Notes (Signed)
EEG Completed; Results Pending  

## 2017-06-27 NOTE — Progress Notes (Signed)
[facility]  PROGRESS NOTE  Walter Goodwin EVO:[id number] DOB: December 01, 1917 DOA: 06/25/2017 PCP: Rosita Fire, MD   Brief HPI:   83 y.o. male with medical history significant of hypertension, prostate cancer 1995, dementia- per patient's wife. History was obtained from the patient and from the patient's wife as patient could not remember the events of today. Patient has a driver who takes him to and from church. Patient was on his way back from church, trying to get out of the car, when he fell on his right knee. Patient tells me he cannot remember the events he thinks he might have passed out.    Subjective   Patient seen and examined, status post treatment of intertrochanteric, pertrochanteric, subtrochanteric fracture with intramedullary implant. Denies pain at this time   Assessment/Plan:     1. Intertrochanteric fracture of right femur, Status post intramedullary implant- been well-controlled, auditory excision following 2. Syncope- workup for syncope underway, echocardiogram showed EF 60-65%, mild LVH. CT head showed moderate atrophy and white matter disease, calcified left frontal meningioma. Will check orthostatic vital signs every shift. EEG has been done, results are pending. Continue monitoring on telemetry.Cardiac enzymes were negative in ED. if workup is negative, might need Holter monitor as outpatient. 3. Acute kidney injury- likely from dehydration, Lasix currently  on hold. Check BMP in a.m. 4. Hypertension- continue amlodipine, blood pressure is well controlled. 5. Hypothyroidism- continue Synthroid.     DVT prophylaxis: SCD's  Code Status: Full code  Family communication- no family present at bedside   Disposition Plan: [place]   Procedures None  Continuous infusions . lactated ringers 10 mL/hr at 06/26/17 1002  . methocarbamol (ROBAXIN)  IV        Antibiotics:   Anti-infectives    Start      Dose/Rate Route Frequency Ordered Stop   06/26/17 1900  ceFAZolin (ANCEF) IVPB 1 g/50 mL premix     1 g 100 mL/hr over 30 Minutes Intravenous Every 6 hours 06/26/17 1548 06/27/17 0732   06/26/17 0730  ceFAZolin (ANCEF) IVPB 2g/100 mL premix     2 g 200 mL/hr over 30 Minutes Intravenous To Short Stay 06/25/17 2147 06/26/17 1339       Objective   Vitals:   06/26/17 1549 06/26/17 1603 06/26/17 2100 06/27/17 0514  BP:  (!) 126/58 (!) 132/53 (!) 130/52  Pulse:  68 73 68  Resp:  18 18 20   Temp:  (!) 97.1 F (36.2 C) 99 F (37.2 C) 98.7 F (37.1 C)  TempSrc:  Oral Oral Oral  SpO2: 93% 92% 97% 94%  Weight:      Height:        Intake/Output Summary (Last 24 hours) at 06/27/17 1608 Last data filed at 06/27/17 1100  Gross per 24 hour  Intake           327.33 ml  Output              900 ml  Net          -572.67 ml   Filed Weights   06/25/17 1322  Weight: 103.4 kg (228 lb)     Physical Examination:   Physical Exam: Eyes: No icterus, extraocular muscles intact  Mouth: Oral mucosa is moist, no lesions on palate,  Neck: Supple, no deformities, masses, or tenderness Lungs: Normal respiratory effort, bilateral clear to auscultation, no crackles or wheezes.  Heart: Regular rate and rhythm, S1 and S2 normal, no  murmurs, rubs auscultated Abdomen: BS normoactive,soft,nondistended,non-tender to palpation,no organomegaly Extremities: No pretibial edema, no erythema, no cyanosis, no clubbing Neuro : Alert and oriented to time, place and person, No focal deficits Skin: No rashes seen on exam    Data Reviewed: I have personally reviewed following labs and imaging studies  CBG:  Recent Labs Lab 06/26/17 2147 06/27/17 0641 06/27/17 1207  GLUCAP 122* 110* 100*    CBC:  Recent Labs Lab 06/25/17 1421  WBC 9.1  NEUTROABS 6.0  HGB 12.3*  HCT 38.1*  MCV 91.4  PLT 376    Basic Metabolic Panel:  Recent Labs Lab 06/25/17 1421  NA 144  K 4.6  CL 106  CO2 28   GLUCOSE 110*  BUN 21*  CREATININE 1.40*  CALCIUM 9.7    Recent Results (from the past 240 hour(s))  Surgical PCR screen     Status: None   Collection Time: 06/26/17  9:23 AM  Result Value Ref Range Status   MRSA, PCR NEGATIVE NEGATIVE Final   Staphylococcus aureus NEGATIVE NEGATIVE Final    Comment:        The Xpert SA Assay (FDA approved for NASAL specimens in patients over 41 years of age), is one component of a comprehensive surveillance program.  Test performance has been validated by [facility] for patients greater than or equal to 63 year old. It is not intended to diagnose infection nor to guide or monitor treatment.      Liver Function Tests: No results for input(s): AST, ALT, ALKPHOS, BILITOT, PROT, ALBUMIN in the last 168 hours. No results for input(s): LIPASE, AMYLASE in the last 168 hours. No results for input(s): AMMONIA in the last 168 hours.  Cardiac Enzymes:  Recent Labs Lab 06/25/17 1421  TROPONINI <0.03   BNP (last 3 results) No results for input(s): BNP in the last 8760 hours.  ProBNP (last 3 results) No results for input(s): PROBNP in the last 8760 hours.    Studies: Ct Head Wo Contrast  Result Date: 06/25/2017 CLINICAL DATA:  Syncopal episode.  Fall.  Landed on knee. EXAM: CT HEAD WITHOUT CONTRAST TECHNIQUE: Contiguous axial images were obtained from the base of the skull through the vertex without intravenous contrast. COMPARISON:  None. FINDINGS: Brain: Moderate generalized atrophy and white matter disease is present bilaterally. The basal ganglia are intact. Insular ribbon is normal bilaterally. The brainstem and cerebellum are within normal limits. A calcified meningioma is noted adjacent to the left frontal lobe measuring 14 x 15 x 10 mm. Vascular: Atherosclerotic calcifications are present within the cavernous internal carotid artery's bilaterally. There is no hyperdense vessel. Skull: The calvarium is intact. No focal lytic or blastic  lesions are present. Sinuses/Orbits: The paranasal sinuses and mastoid air cells are clear. IMPRESSION: 1. Moderate atrophy and white matter disease. 2. No acute intracranial abnormality. 3. 15 mm calcified left frontal meningioma Electronically Signed   By: San Morelle M.D.   On: 06/25/2017 21:01   Dg C-arm 1-60 Min  Result Date: 06/26/2017 CLINICAL DATA:  Right femoral nail. EXAM: DG C-ARM 61-120 MIN; OPERATIVE RIGHT HIP WITH PELVIS COMPARISON:  Radiography from yesterday FINDINGS: AP and lateral intraoperative fluoroscopic images show placement of a dynamic hip screw and short femoral nail. No evidence of intraoperative fracture or hardware displacement. IMPRESSION: Fluoroscopy for proximal right femur fracture ORIF. No unexpected finding. Electronically Signed   By: Monte Fantasia M.D.   On: 06/26/2017 14:31   Dg Hip Port Unilat With Pelvis 1v Right  Result Date: 06/26/2017 CLINICAL DATA:  Status post ORIF of right intertrochanteric hip fracture. EXAM: DG HIP (WITH OR WITHOUT PELVIS) 1V PORT RIGHT COMPARISON:  06/26/2015 as well as intraoperative imaging earlier today. FINDINGS: Alignment after ORIF is grossly near anatomic. Intramedullary nail and screw across the level of intratrochanteric fracture show appropriate positioning. IMPRESSION: Grossly anatomic alignment following ORIF of a right intertrochanteric hip fracture. Electronically Signed   By: Aletta Edouard M.D.   On: 06/26/2017 16:29   Dg Hip Operative Unilat W Or W/o Pelvis Right  Result Date: 06/26/2017 CLINICAL DATA:  Right femoral nail. EXAM: DG C-ARM 61-120 MIN; OPERATIVE RIGHT HIP WITH PELVIS COMPARISON:  Radiography from yesterday FINDINGS: AP and lateral intraoperative fluoroscopic images show placement of a dynamic hip screw and short femoral nail. No evidence of intraoperative fracture or hardware displacement. IMPRESSION: Fluoroscopy for proximal right femur fracture ORIF. No unexpected finding. Electronically Signed    By: Monte Fantasia M.D.   On: 06/26/2017 14:31   Dg Femur, Min 2 Views Right  Result Date: 06/25/2017 CLINICAL DATA:  Fall today. Intertrochanteric right proximal femur fracture. EXAM: RIGHT FEMUR 2 VIEWS COMPARISON:  Right hip and right knee radiographs from earlier today. FINDINGS: Please note that the proximal 2/3 of the right femur are not imaged in a lateral view due to patient's inability to cooperate due to discomfort. Re- demonstration of mildly impacted intertrochanteric right proximal femur fracture without significant displacement. No additional fracture. No evidence of dislocation at the right hip or right knee. No suspicious focal osseous lesions. Severe tricompartmental right knee osteoarthritis. Moderate right hip osteoarthritis. No radiopaque foreign body. Brachytherapy seeds overlie the location of the prostate. IMPRESSION: Re- demonstration of intertrochanteric right proximal femur fracture. No additional fracture, with limitations as described. Electronically Signed   By: Ilona Sorrel M.D.   On: 06/25/2017 17:46    Scheduled Meds: . amLODipine  10 mg Oral Daily  . atorvastatin  10 mg Oral Daily  . levothyroxine  150 mcg Oral QAC breakfast  . naphazoline-glycerin  1-2 drop Both Eyes BID  . potassium chloride SA  20 mEq Oral Daily  . sertraline  100 mg Oral Daily  . vitamin B-12  1,000 mcg Oral Daily      Time spent: 25 min  [facility] Pager [phone]. If 7PM-7AM, please contact night-coverage at [email or web address], Office  [phone]  password [email or web address]  06/27/2017, 4:08 PM  LOS: 2 days

## 2017-06-27 NOTE — Evaluation (Signed)
Physical Therapy Evaluation Patient Details Name: BARNET BENAVIDES MRN: [id number] DOB: 1917/04/19 Today's Date: 06/27/2017   History of Present Illness  Patient is a 83 y/o male who presents with right hip fx s/p fall, now s/p IM nail RLE. PMH includes dementia, HTN, high cholesterol, ca, renal disorder.  Clinical Impression  Patient presents with pain and post surgical deficits s/p above surgery. Tolerated bed mobility and standing transfer with Max A of 2. Recommend using stedy to get to chair. Pt reluctant to place full weight through RLE in standing. Sp02 ranged from 85-92% on RA during session. Pt Mod I PTA and used RW for support. Pt and family report pt plans to d/c to SNF. Will follow acutely to maximize independence and mobility.    Follow Up Recommendations Supervision for mobility/OOB;Supervision/Assistance - 24 hour;DC plan and follow up therapy as arranged by surgeon    Equipment Recommendations  None recommended by PT    Recommendations for Other Services OT consult     Precautions / Restrictions Precautions Precautions: Fall Restrictions Weight Bearing Restrictions: Yes RLE Weight Bearing: Weight bearing as tolerated      Mobility  Bed Mobility Overal bed mobility: Needs Assistance Bed Mobility: Supine to Sit;Sit to Supine     Supine to sit: Max assist;+2 for physical assistance;HOB elevated Sit to supine: Max assist;+2 for physical assistance   General bed mobility comments: Assist with RLE, trunk and to scoot bottom to EOB. Increased time and cues. Assist of 2 to return to supine.   Transfers Overall transfer level: Needs assistance Equipment used: Rolling walker (2 wheeled) Transfers: Sit to/from Stand Sit to Stand: Max assist;+2 physical assistance;From elevated surface         General transfer comment: Assist of 2 to power to standing with pt pulling up on RW (how he does at home); Cues for upright posture and hip extension. Able to shuffle right foot  towards HOB x2 steps.  Ambulation/Gait             General Gait Details: Pt unable.  Stairs            Wheelchair Mobility    Modified Alms (Stroke Patients Only)       Balance Overall balance assessment: Needs assistance Sitting-balance support: Feet supported;No upper extremity supported Sitting balance-Leahy Scale: Fair Sitting balance - Comments: Able to reach down and adjust socks with increased time. 2/4 DOE with this task. Sp02 85% on RA.    Standing balance support: During functional activity;Bilateral upper extremity supported Standing balance-Leahy Scale: Poor Standing balance comment: Reliant on UEs for support and external assist from therapists to stand.                             Pertinent Vitals/Pain Pain Assessment: Faces Faces Pain Scale: Hurts a little bit Pain Location: right hip with mobility Pain Descriptors / Indicators: Operative site guarding;Guarding Pain Intervention(s): Monitored during session;Repositioned;Limited activity within patient's tolerance    Home Living Family/patient expects to be discharged to:: Skilled nursing facility Living Arrangements: Spouse/significant other Available Help at Discharge: Family;Available 24 hours/day Type of Home: House Home Access: Level entry     Home Layout: One level Home Equipment: Walker - 2 wheels;Walker - 4 wheels      Prior Function Level of Independence: Independent with assistive device(s)   Gait / Transfers Assistance Needed: Uses RW vs rollator PTA.   ADL's / Homemaking Assistance Needed: [place] for self.  Family helps with IADLs.        Hand Dominance   Dominant Hand: Right    Extremity/Trunk Assessment   Upper Extremity Assessment Upper Extremity Assessment: Defer to OT evaluation    Lower Extremity Assessment Lower Extremity Assessment: RLE deficits/detail RLE Deficits / Details: Limited AROM/strength secondary to post op; not able to perform  LAQ. RLE Sensation: decreased light touch       Communication   Communication: No difficulties  Cognition Arousal/Alertness: Awake/alert Behavior During Therapy: WFL for tasks assessed/performed Overall Cognitive Status: History of cognitive impairments - at baseline                                 General Comments: Needs repetition of cues.      General Comments General comments (skin integrity, edema, etc.): Family present during session. Sp02 ranged from 85-92% on RA.     Exercises     Assessment/Plan    PT Assessment Patient needs continued PT services  PT Problem List Decreased strength;Decreased mobility;Pain;Decreased balance;Impaired sensation;Decreased cognition;Cardiopulmonary status limiting activity;Decreased activity tolerance;Decreased range of motion       PT Treatment Interventions Therapeutic activities;Gait training;Therapeutic exercise;Patient/family education;Balance training;Functional mobility training    PT Goals (Current goals can be found in the Care Plan section)  Acute Rehab PT Goals Patient Stated Goal: to go to rehab and get stronger PT Goal Formulation: With patient/family Time For Goal Achievement: 07/11/17 Potential to Achieve Goals: Fair    Frequency Min 3X/week   Barriers to discharge        Co-evaluation PT/OT/SLP Co-Evaluation/Treatment: Yes Reason for Co-Treatment: For patient/therapist safety;To address functional/ADL transfers PT goals addressed during session: Mobility/safety with mobility;Balance;Strengthening/ROM         AM-PAC PT "6 Clicks" Daily Activity  Outcome Measure Difficulty turning over in bed (including adjusting bedclothes, sheets and blankets)?: Total Difficulty moving from lying on back to sitting on the side of the bed? : Total Difficulty sitting down on and standing up from a chair with arms (e.g., wheelchair, bedside commode, etc,.)?: Total Help needed moving to and from a bed to chair  (including a wheelchair)?: Total Help needed walking in hospital room?: Total Help needed climbing 3-5 steps with a railing? : Total 6 Click Score: 6    End of Session Equipment Utilized During Treatment: Gait belt Activity Tolerance: Patient tolerated treatment well Patient left: in bed;with call bell/phone within reach;with family/visitor present;with bed alarm set;Other (comment) (transport present to take pt down for test. ) Nurse Communication: Mobility status;Need for lift equipment PT Visit Diagnosis: Muscle weakness (generalized) (M62.81);Difficulty in walking, not elsewhere classified (R26.2);Unsteadiness on feet (R26.81);Pain Pain - Right/Left: Right Pain - part of body: Leg    Time: 1353-1420 PT Time Calculation (min) (ACUTE ONLY): 27 min   Charges:   PT Evaluation $PT Eval Moderate Complexity: 1 Procedure     PT G Codes:        Wray Kearns, PT, DPT [phone]    Marguarite Arbour A Ramesha Poster 06/27/2017, 3:51 PM

## 2017-06-27 NOTE — Plan of Care (Signed)
Problem: Safety: Goal: Ability to remain free from injury will improve Fall mats in place. Bed alarm on

## 2017-06-27 NOTE — Social Work (Signed)
FL2 started and it is pending PT evaluation has not been completed yet.  CSW will f/u at that time to send out offers to [place]  Elissa Hefty, [place] Worker [phone]

## 2017-06-27 NOTE — Clinical Social Work Note (Signed)
Clinical Social Work Assessment  Patient Details  Name: Walter Goodwin MRN: [id number] Date of Birth: 01-24-1917  Date of referral:  06/27/17               Reason for consult:  Facility Placement                Permission sought to share information with:    Permission granted to share information::  Yes, Verbal Permission Granted  Name::     Walter Goodwin  Agency::  SNF-[place], close to home  Relationship::  spouse  Contact Information:     Housing/Transportation Living arrangements for the past 2 months:  Single Family Home Source of Information:  Patient, Spouse, Friend/Neighbor Patient Interpreter Needed:  None Criminal Activity/Legal Involvement Pertinent to Current Situation/Hospitalization:  No - Comment as needed Significant Relationships:  Spouse Lives with:  Spouse Do you feel safe going back to the place where you live?  Yes Need for family participation in patient care:  Yes (Comment)  Care giving concerns:  Pt resided at home with spouse prior to hospitalization. Pt fell and injured self. Pt not safe to return home at this time as spouse may not be able to care for him with impairment.  Social Worker assessment / plan:  CSW met with patient, spouse and friend at bedside and discuss possible recommendations for short term rehab. Family is open to short term rehab and does want to go home if patient is medically able to do so. CSW explained her role and SNF options/placement. CSW obtained permission from family to send out offers to [place] area as that is close to home.  FL2 pending PT notes. Passr obtained. Offers to be sent out once FL2 completed.  Employment status:  Retired Nurse, adult PT Recommendations:  [place] / Referral to community resources:  [place]  Patient/Family's Response to care:  Patient/family are appreciative of CSW assistance with the process. No issues  or concerns reported at this time.  Patient/Family's Understanding of and Emotional Response to Diagnosis, Current Treatment, and Prognosis:  Patient/family has good understanding of diagnosis, current treatment and prognosis at this time. Patient/family hopeful that patient can go home at DC and is open to a SNF placement if medically necessary for improvement. No issues or concerns reported at this time.  Emotional Assessment Appearance:  Appears stated age Attitude/Demeanor/Rapport:   (Cooperative and Friendly) Affect (typically observed):  Accepting, Appropriate Orientation:  Oriented to Self Alcohol / Substance use:  Not Applicable Psych involvement (Current and /or in the community):  No (Comment)  Discharge Needs  Concerns to be addressed:  Care Coordination Readmission within the last 30 days:  No Current discharge risk:  Physical Impairment, Cognitively Impaired Barriers to Discharge:  No Barriers Identified   Normajean Baxter, LCSW 06/27/2017, 12:42 PM

## 2017-06-28 ENCOUNTER — Inpatient Hospital Stay
Admission: RE | Admit: 2017-06-28 | Discharge: 2017-08-02 | Disposition: A | Discharge status: Home / Self Care | Payer: Medicare Other | Source: Ambulatory Visit | Attending: Internal Medicine | Admitting: Internal Medicine

## 2017-06-28 DIAGNOSIS — Z4789 Encounter for other orthopedic aftercare: Secondary | ICD-10-CM | POA: Diagnosis not present

## 2017-06-28 DIAGNOSIS — I10 Essential (primary) hypertension: Secondary | ICD-10-CM

## 2017-06-28 DIAGNOSIS — M6281 Muscle weakness (generalized): Secondary | ICD-10-CM | POA: Diagnosis not present

## 2017-06-28 DIAGNOSIS — F0390 Unspecified dementia without behavioral disturbance: Secondary | ICD-10-CM | POA: Diagnosis not present

## 2017-06-28 DIAGNOSIS — Z8781 Personal history of (healed) traumatic fracture: Secondary | ICD-10-CM

## 2017-06-28 DIAGNOSIS — E039 Hypothyroidism, unspecified: Secondary | ICD-10-CM | POA: Diagnosis not present

## 2017-06-28 DIAGNOSIS — W19XXXA Unspecified fall, initial encounter: Secondary | ICD-10-CM | POA: Diagnosis not present

## 2017-06-28 DIAGNOSIS — D649 Anemia, unspecified: Secondary | ICD-10-CM | POA: Diagnosis not present

## 2017-06-28 DIAGNOSIS — E785 Hyperlipidemia, unspecified: Secondary | ICD-10-CM | POA: Diagnosis not present

## 2017-06-28 DIAGNOSIS — S728X9A Other fracture of unspecified femur, initial encounter for closed fracture: Secondary | ICD-10-CM | POA: Diagnosis not present

## 2017-06-28 DIAGNOSIS — R279 Unspecified lack of coordination: Secondary | ICD-10-CM | POA: Diagnosis not present

## 2017-06-28 DIAGNOSIS — S72144S Nondisplaced intertrochanteric fracture of right femur, sequela: Secondary | ICD-10-CM | POA: Diagnosis not present

## 2017-06-28 DIAGNOSIS — S72144A Nondisplaced intertrochanteric fracture of right femur, initial encounter for closed fracture: Secondary | ICD-10-CM | POA: Diagnosis not present

## 2017-06-28 DIAGNOSIS — R55 Syncope and collapse: Secondary | ICD-10-CM | POA: Diagnosis not present

## 2017-06-28 LAB — BASIC METABOLIC PANEL
Anion gap: 6 (ref 5–15)
BUN: 18 mg/dL (ref 6–20)
CO2: 25 mmol/L (ref 22–32)
CREATININE: 1.29 mg/dL — AB (ref 0.61–1.24)
Calcium: 9 mg/dL (ref 8.9–10.3)
Chloride: 106 mmol/L (ref 101–111)
GFR calc Af Amer: 51 mL/min — ABNORMAL LOW (ref >60)
GFR, EST NON AFRICAN AMERICAN: 44 mL/min — AB (ref >60)
GLUCOSE: 108 mg/dL — AB (ref 65–99)
POTASSIUM: 4.2 mmol/L (ref 3.5–5.1)
Sodium: 137 mmol/L (ref 135–145)

## 2017-06-28 LAB — CBC
HCT: 30.1 % — ABNORMAL LOW (ref 39.0–52.0)
Hemoglobin: 9.6 g/dL — ABNORMAL LOW (ref 13.0–17.0)
MCH: 29.2 pg (ref 26.0–34.0)
MCHC: 31.9 g/dL (ref 30.0–36.0)
MCV: 91.5 fL (ref 78.0–100.0)
PLATELETS: 199 10*3/uL (ref 150–400)
RBC: 3.29 MIL/uL — AB (ref 4.22–5.81)
RDW: 16 % — AB (ref 11.5–15.5)
WBC: 10.2 10*3/uL (ref 4.0–10.5)

## 2017-06-28 LAB — GLUCOSE, CAPILLARY
Glucose-Capillary: 111 mg/dL — ABNORMAL HIGH (ref 65–99)
Glucose-Capillary: 98 mg/dL (ref 65–99)

## 2017-06-28 MED ORDER — TRAMADOL HCL 50 MG PO TABS: 50.0000 mg | ORAL_TABLET | Freq: Four times a day (QID) | ORAL | 0 refills | Status: DC | PRN

## 2017-06-28 NOTE — Clinical Social Work Placement (Signed)
   CLINICAL SOCIAL WORK PLACEMENT  NOTE  Date:  06/28/2017  Patient Details  Name: Walter Goodwin MRN: [id number] Date of Birth: Aug 30, 1917  Clinical Social Work is seeking post-discharge placement for this patient at the [place] level of care (*CSW will initial, date and re-position this form in  chart as items are completed):  Yes   Patient/family provided with [facility] Work Department's list of facilities offering this level of care within the geographic area requested by the patient (or if unable, by the patient's family).  Yes   Patient/family informed of their freedom to choose among providers that offer the needed level of care, that participate in Medicare, Medicaid or managed care program needed by the patient, have an available bed and are willing to accept the patient.  Yes   Patient/family informed of [facility]'s ownership interest in [facility] and [facility], as well as of the fact that they are under no obligation to receive care at these facilities.  PASRR submitted to EDS on       PASRR number received on 06/27/17     Existing PASRR number confirmed on 06/27/17     FL2 transmitted to all facilities in geographic area requested by pt/family on 06/27/17     FL2 transmitted to all facilities within larger geographic area on 06/27/17     Patient informed that his/her managed care company has contracts with or will negotiate with certain facilities, including the following:        Yes   Patient/family informed of bed offers received.  Patient chooses bed at [facility]     Physician recommends and patient chooses bed at      Patient to be transferred to [facility] on 06/28/17.  Patient to be transferred to facility by [facility]     Patient family notified on 06/28/17 of transfer.  Name of family member notified:  [name], spouse     PHYSICIAN Please prepare priority discharge summary,  including medications, Please prepare prescriptions, Please sign FL2     Additional Comment:    _______________________________________________ Normajean Baxter, LCSW 06/28/2017, 12:39 PM

## 2017-06-28 NOTE — Discharge Summary (Signed)
Physician Discharge Summary  Walter Goodwin RJJ:[id number] DOB: [birth year] DOA: 06/25/2017  PCP: Rosita Fire, MD  Admit date: 06/25/2017 Discharge date: 06/28/2017  Admitted From:(Home) Disposition:  SNIF  Recommendations for Outpatient Follow-up:  1. Follow up with PCP in 1-2 weeks   Home Health:NO Equipment/Devices:NO  Discharge Condition:(Stable) CODE STATUS: (FULL)  Diet recommendation: Heart Healthy    Brief/Interim Summary:  83 y.o. male who complains of right hip pain after He sustained a mechanical fall when he got tripped up by his walker. He was evaluated in the [facility] emergency department in [place]. There he was found to have a right intertrochanteric hip fracture, recommended to be  transfered down to the [facility] for definitive operative management s/p intramedullary nail for right hip fracture by ortho on 06/26/16 . Patient with no complication after the surgery , pain is controlled , Will be discharged to the rehabilitation after family approval. He was seen face-to-face before discharge and more than 30 minutes spent to urgent for discharge.    Discharge Diagnoses:   1. Intertrochanteric fracture of right femur, Status post intramedullary implant. 2. Syncope vs Mechanical fall- workup for syncope was negative , echocardiogram showed EF 60-65%, mild LVH. CT head showed moderate atrophy and white matter disease, calcified left frontal meningioma. ,  EEG has been done and was normal. 3. Acute kidney injury- likely from dehydration, Lasix currently  on hold. 4. Hypertension- continue amlodipine, hold Lasix with the risk of dehydration and fall. 5. Hypothyroidism- continue Synthroid.    Discharge Instructions  Discharge Instructions    Diet - low sodium heart healthy    Complete by:  As directed    Increase activity slowly    Complete by:  As directed      Allergies as of 06/28/2017   No Known Allergies     Medication List    STOP  taking these medications   furosemide 40 MG tablet Commonly known as:  LASIX   potassium chloride SA 20 MEQ tablet Commonly known as:  K-DUR,KLOR-CON     TAKE these medications   acetaminophen 325 MG tablet Commonly known as:  TYLENOL Take 650 mg by mouth every 6 (six) hours as needed for mild pain.   amLODipine 10 MG tablet Commonly known as:  NORVASC Take 10 mg by mouth daily.   aspirin EC 81 MG tablet Take 1 tablet (81 mg total) by mouth 2 (two) times daily.   atorvastatin 10 MG tablet Commonly known as:  LIPITOR Take 10 mg by mouth daily.   COLACE 100 MG capsule Generic drug:  docusate sodium Take 100 mg by mouth 2 (two) times daily.   [employer] EYE DROPS 0.05 % ophthalmic solution Generic drug:  tetrahydrozoline Place 1-2 drops into both eyes 2 (two) times daily as needed.   levothyroxine 150 MCG tablet Commonly known as:  SYNTHROID, LEVOTHROID Take 150 mcg by mouth daily before breakfast.   sertraline 100 MG tablet Commonly known as:  ZOLOFT Take 100 mg by mouth daily.   traMADol 50 MG tablet Commonly known as:  ULTRAM Take 1-2 tablets (50-100 mg total) by mouth every 6 (six) hours as needed.   vitamin B-12 1000 MCG tablet Commonly known as:  CYANOCOBALAMIN Take 1,000 mcg by mouth daily.      Follow-up Information    Nicholes Stairs, MD. Schedule an appointment as soon as possible for a visit in 2 weeks.   Specialty:  Orthopedic Surgery Why:  For wound re-check Contact  information: [address] [place] [address] [phone]          No Known Allergies  Consultations:  Ortho.     Procedures/Studies: Dg Chest 1 View  Result Date: 06/25/2017 CLINICAL DATA:  Fall today EXAM: CHEST 1 VIEW COMPARISON:  01/26/2017 chest CT FINDINGS: Top-normal heart size. Mildly tortuous atherosclerotic thoracic aorta. Otherwise stable mediastinal contour with superior mediastinal widening due to known cord are. No pneumothorax. No pleural  effusion. Slightly low lung volumes with vascular crowding, without overt pulmonary edema. No acute consolidative airspace disease. IMPRESSION: No active cardiopulmonary disease. Electronically Signed   By: Ilona Sorrel M.D.   On: 06/25/2017 14:17   Ct Head Wo Contrast  Result Date: 06/25/2017 CLINICAL DATA:  Syncopal episode.  Fall.  Landed on knee. EXAM: CT HEAD WITHOUT CONTRAST TECHNIQUE: Contiguous axial images were obtained from the base of the skull through the vertex without intravenous contrast. COMPARISON:  None. FINDINGS: Brain: Moderate generalized atrophy and white matter disease is present bilaterally. The basal ganglia are intact. Insular ribbon is normal bilaterally. The brainstem and cerebellum are within normal limits. A calcified meningioma is noted adjacent to the left frontal lobe measuring 14 x 15 x 10 mm. Vascular: Atherosclerotic calcifications are present within the cavernous internal carotid artery's bilaterally. There is no hyperdense vessel. Skull: The calvarium is intact. No focal lytic or blastic lesions are present. Sinuses/Orbits: The paranasal sinuses and mastoid air cells are clear. IMPRESSION: 1. Moderate atrophy and white matter disease. 2. No acute intracranial abnormality. 3. 15 mm calcified left frontal meningioma Electronically Signed   By: San Morelle M.D.   On: 06/25/2017 21:01   Dg Knee Complete 4 Views Right  Result Date: 06/25/2017 CLINICAL DATA:  Right knee pain after a fall today. EXAM: RIGHT KNEE - COMPLETE 4+ VIEW COMPARISON:  None. FINDINGS: AP and lateral views of the right knee are limited by on difficulty positioning due to a right hip fracture. There are marked tricompartmental degenerative changes. There is a small effusion. No visible fracture or dislocation. Atheromatous arterial calcifications. IMPRESSION: 1. Limited examination with no visible fracture or dislocation. 2. Extensive tricompartmental degenerative changes and small effusion.  Electronically Signed   By: Claudie Revering M.D.   On: 06/25/2017 14:16   Dg C-arm 1-60 Min  Result Date: 06/26/2017 CLINICAL DATA:  Right femoral nail. EXAM: DG C-ARM 61-120 MIN; OPERATIVE RIGHT HIP WITH PELVIS COMPARISON:  Radiography from yesterday FINDINGS: AP and lateral intraoperative fluoroscopic images show placement of a dynamic hip screw and short femoral nail. No evidence of intraoperative fracture or hardware displacement. IMPRESSION: Fluoroscopy for proximal right femur fracture ORIF. No unexpected finding. Electronically Signed   By: Monte Fantasia M.D.   On: 06/26/2017 14:31   Dg Hip Port Unilat With Pelvis 1v Right  Result Date: 06/26/2017 CLINICAL DATA:  Status post ORIF of right intertrochanteric hip fracture. EXAM: DG HIP (WITH OR WITHOUT PELVIS) 1V PORT RIGHT COMPARISON:  06/26/2015 as well as intraoperative imaging earlier today. FINDINGS: Alignment after ORIF is grossly near anatomic. Intramedullary nail and screw across the level of intratrochanteric fracture show appropriate positioning. IMPRESSION: Grossly anatomic alignment following ORIF of a right intertrochanteric hip fracture. Electronically Signed   By: Aletta Edouard M.D.   On: 06/26/2017 16:29   Dg Hip Operative Unilat W Or W/o Pelvis Right  Result Date: 06/26/2017 CLINICAL DATA:  Right femoral nail. EXAM: DG C-ARM 61-120 MIN; OPERATIVE RIGHT HIP WITH PELVIS COMPARISON:  Radiography from yesterday  FINDINGS: AP and lateral intraoperative fluoroscopic images show placement of a dynamic hip screw and short femoral nail. No evidence of intraoperative fracture or hardware displacement. IMPRESSION: Fluoroscopy for proximal right femur fracture ORIF. No unexpected finding. Electronically Signed   By: Monte Fantasia M.D.   On: 06/26/2017 14:31   Dg Hip Unilat W Or Wo Pelvis 2-3 Views Right  Result Date: 06/25/2017 CLINICAL DATA:  Right hip pain following a fall today. EXAM: DG HIP (WITH OR WITHOUT PELVIS) 2-3V RIGHT  COMPARISON:  None. FINDINGS: Mildly comminuted right intertrochanteric fracture with mild varus angulation. No significant displacement. Prostate radiation seed implants. Lower lumbar spine degenerative changes. Diffuse osteopenia. IMPRESSION: Right intertrochanteric fracture, as described above. Electronically Signed   By: Claudie Revering M.D.   On: 06/25/2017 14:17   Dg Femur, Min 2 Views Right  Result Date: 06/25/2017 CLINICAL DATA:  Fall today. Intertrochanteric right proximal femur fracture. EXAM: RIGHT FEMUR 2 VIEWS COMPARISON:  Right hip and right knee radiographs from earlier today. FINDINGS: Please note that the proximal 2/3 of the right femur are not imaged in a lateral view due to patient's inability to cooperate due to discomfort. Re- demonstration of mildly impacted intertrochanteric right proximal femur fracture without significant displacement. No additional fracture. No evidence of dislocation at the right hip or right knee. No suspicious focal osseous lesions. Severe tricompartmental right knee osteoarthritis. Moderate right hip osteoarthritis. No radiopaque foreign body. Brachytherapy seeds overlie the location of the prostate. IMPRESSION: Re- demonstration of intertrochanteric right proximal femur fracture. No additional fracture, with limitations as described. Electronically Signed   By: Ilona Sorrel M.D.   On: 06/25/2017 17:46    (Echo, Carotid, EGD, Colonoscopy, ERCP)    Subjective:   Discharge Exam: Vitals:   06/27/17 2214 06/28/17 0431  BP: (!) 129/56 (!) 121/45  Pulse: 67 67  Resp: 18 16  Temp: 99.1 F (37.3 C) 98.4 F (36.9 C)   Vitals:   06/27/17 0514 06/27/17 1500 06/27/17 2214 06/28/17 0431  BP: (!) 130/52 (!) 132/50 (!) 129/56 (!) 121/45  Pulse: 68 67 67 67  Resp: 20 19 18 16   Temp: 98.7 F (37.1 C) 98.6 F (37 C) 99.1 F (37.3 C) 98.4 F (36.9 C)  TempSrc: Oral Oral Oral Oral  SpO2: 94% 98% 90% 100%  Weight:      Height:         General:NAD. Cardiovascular: RRR, S1/S2 +, no rubs, no gallops Respiratory: CTA bilaterally, no wheezing, no rhonchi Abdominal: Soft , ND,NT. Extremities: no edema, no cyanosis    The results of significant diagnostics from this hospitalization (including imaging, microbiology, ancillary and laboratory) are listed below for reference.     Microbiology: Recent Results (from the past 240 hour(s))  Surgical PCR screen     Status: None   Collection Time: 06/26/17  9:23 AM  Result Value Ref Range Status   MRSA, PCR NEGATIVE NEGATIVE Final   Staphylococcus aureus NEGATIVE NEGATIVE Final    Comment:        The Xpert SA Assay (FDA approved for NASAL specimens in patients over 81 years of age), is one component of a comprehensive surveillance program.  Test performance has been validated by [facility] for patients greater than or equal to 11 year old. It is not intended to diagnose infection nor to guide or monitor treatment.      Labs: BNP (last 3 results) No results for input(s): BNP in the last 8760 hours. Basic Metabolic Panel:  Recent  Labs Lab 06/25/17 1421 06/28/17 0325  NA 144 137  K 4.6 4.2  CL 106 106  CO2 28 25  GLUCOSE 110* 108*  BUN 21* 18  CREATININE 1.40* 1.29*  CALCIUM 9.7 9.0   Liver Function Tests: No results for input(s): AST, ALT, ALKPHOS, BILITOT, PROT, ALBUMIN in the last 168 hours. No results for input(s): LIPASE, AMYLASE in the last 168 hours. No results for input(s): AMMONIA in the last 168 hours. CBC:  Recent Labs Lab 06/25/17 1421 06/28/17 0325  WBC 9.1 10.2  NEUTROABS 6.0  --   HGB 12.3* 9.6*  HCT 38.1* 30.1*  MCV 91.4 91.5  PLT 249 199   Cardiac Enzymes:  Recent Labs Lab 06/25/17 1421  TROPONINI <0.03   BNP: Invalid input(s): POCBNP CBG:  Recent Labs Lab 06/27/17 1207 06/27/17 1643 06/27/17 2209 06/28/17 0626 06/28/17 1147  GLUCAP 100* 121* 111* 111* 98   D-Dimer No results for input(s): DDIMER in the  last 72 hours. Hgb A1c No results for input(s): HGBA1C in the last 72 hours. Lipid Profile No results for input(s): CHOL, HDL, LDLCALC, TRIG, CHOLHDL, LDLDIRECT in the last 72 hours. Thyroid function studies No results for input(s): TSH, T4TOTAL, T3FREE, THYROIDAB in the last 72 hours.  Invalid input(s): FREET3 Anemia work up No results for input(s): VITAMINB12, FOLATE, FERRITIN, TIBC, IRON, RETICCTPCT in the last 72 hours. Urinalysis No results found for: COLORURINE, APPEARANCEUR, [place], [place], [place], [place], [place], [place], PROTEINUR, UROBILINOGEN, NITRITE, LEUKOCYTESUR Sepsis Labs Invalid input(s): PROCALCITONIN,  WBC,  LACTICIDVEN Microbiology Recent Results (from the past 240 hour(s))  Surgical PCR screen     Status: None   Collection Time: 06/26/17  9:23 AM  Result Value Ref Range Status   MRSA, PCR NEGATIVE NEGATIVE Final   Staphylococcus aureus NEGATIVE NEGATIVE Final    Comment:        The Xpert SA Assay (FDA approved for NASAL specimens in patients over 45 years of age), is one component of a comprehensive surveillance program.  Test performance has been validated by [facility] for patients greater than or equal to 38 year old. It is not intended to diagnose infection nor to guide or monitor treatment.      Time coordinating discharge: Over 30 minutes  SIGNED:   Waldron Session, MD  [facility] 06/28/2017, 12:08 PM Pager   If 7PM-7AM, please contact night-coverage [email or web address] Password [email or web address]

## 2017-06-28 NOTE — Social Work (Signed)
CSW spoke to spouse and she selected [facility] for short term rehab.  CSW will f/u for DC.  Elissa Hefty, LCSW Clinical Social Worker [phone]

## 2017-06-28 NOTE — Clinical Social Work Note (Signed)
Pt ready for discharge today and going to [facility]. Pt and family (at bedside) aware and agreeable to discharge plan. CSW sent clinicals and confirmed bed offer with Marianna Fuss at [facility]. RN will call report to [phone] for room 151. CSW arranged transportation with [facility]. Family requested diapers for pt. CSW informed family that we do not have diapers, but will provide paper scrubs since pt does not have personal clothes with him. CSW made family aware [facility] is being called now. CSW signing off as no further Social Work needs identified.   Oretha Ellis, [place], [place] Work [phone]

## 2017-06-28 NOTE — NC FL2 (Signed)
[place] LEVEL OF CARE SCREENING TOOL     IDENTIFICATION  Patient Name: Walter Goodwin Birthdate: 1917/01/05 Sex: male Admission Date (Current Location): 06/25/2017  [facility] and [place] Number:  Herbalist and Address:  [facility] [place] [address], [place], [place] [address]      Provider Number: [id number]  Attending Physician Name and Address:  Waldron Session, MD  Relative Name and Phone Number:  Mrs. Jayanth Szczesniak, DX#[phone]    Current Level of Care: Hospital Recommended Level of Care: [place] Prior Approval Number:    Date Approved/Denied: 06/27/17 PASRR Number: [id number]  Discharge Plan: SNF    Current Diagnoses: Patient Active Problem List   Diagnosis Date Noted  . Essential hypertension 06/25/2017  . Intertrochanteric fracture of right femur ([place]) 06/25/2017  . Knee pain, right 06/25/2017  . Syncope 06/25/2017    Orientation RESPIRATION BLADDER Height & Weight     Self  Normal Incontinent, External catheter Weight: 228 lb (103.4 kg) Height:  6' (182.9 cm)  BEHAVIORAL SYMPTOMS/MOOD NEUROLOGICAL BOWEL NUTRITION STATUS      Continent Diet (See DC Summary)  AMBULATORY STATUS COMMUNICATION OF NEEDS Skin   Extensive Assist Verbally Surgical wounds (Right Closed Femur Incision, Adhesive Bandage)                       Personal Care Assistance Level of Assistance  Bathing, Feeding, Dressing Bathing Assistance: Maximum assistance Feeding assistance: Independent Dressing Assistance: Maximum assistance     Functional Limitations Info             SPECIAL CARE FACTORS FREQUENCY  PT (By licensed PT), OT (By licensed OT)     PT Frequency: 3x week OT Frequency: 3xweek            Contractures      Additional Factors Info  Code Status, Allergies Code Status Info: Full Code Allergies Info: No Known Allergies            Current Medications (06/28/2017):  This is the  current hospital active medication list Current Facility-Administered Medications  Medication Dose Route Frequency Provider Last Rate Last Dose  . acetaminophen (TYLENOL) tablet 650 mg  650 mg Oral Q6H PRN Nicholes Stairs, MD       Or  . acetaminophen (TYLENOL) suppository 650 mg  650 mg Rectal Q6H PRN Nicholes Stairs, MD      . amLODipine (NORVASC) tablet 10 mg  10 mg Oral Daily Emokpae, Ejiroghene E, MD   10 mg at 06/28/17 0943  . atorvastatin (LIPITOR) tablet 10 mg  10 mg Oral Daily Emokpae, Ejiroghene E, MD   10 mg at 06/28/17 0943  . lactated ringers infusion   Intravenous Continuous Annye Asa, MD 10 mL/hr at 06/26/17 1002    . levothyroxine (SYNTHROID, LEVOTHROID) tablet 150 mcg  150 mcg Oral QAC breakfast Emokpae, Ejiroghene E, MD   150 mcg at 06/28/17 0943  . methocarbamol (ROBAXIN) tablet 500 mg  500 mg Oral Q6H PRN Nicholes Stairs, MD   500 mg at 06/28/17 0355   Or  . methocarbamol (ROBAXIN) 500 mg in dextrose 5 % 50 mL IVPB  500 mg Intravenous Q6H PRN Nicholes Stairs, MD      . metoCLOPramide [facility]) tablet 5-10 mg  5-10 mg Oral Q8H PRN Nicholes Stairs, MD       Or  . metoCLOPramide (REGLAN) injection 5-10 mg  5-10 mg Intravenous  Q8H PRN Nicholes Stairs, MD      . morphine 4 MG/ML injection 1 mg  1 mg Intravenous Q2H PRN Nicholes Stairs, MD   1 mg at 06/26/17 1559  . naphazoline-glycerin (CLEAR EYES) ophth solution 1-2 drop  1-2 drop Both Eyes BID Emokpae, Ejiroghene E, MD   2 drop at 06/28/17 0943  . ondansetron (ZOFRAN) tablet 4 mg  4 mg Oral Q6H PRN Nicholes Stairs, MD       Or  . ondansetron [facility]) injection 4 mg  4 mg Intravenous Q6H PRN Nicholes Stairs, MD      . potassium chloride SA (K-DUR,KLOR-CON) CR tablet 20 mEq  20 mEq Oral Daily Emokpae, Ejiroghene E, MD   20 mEq at 06/28/17 0943  . sertraline (ZOLOFT) tablet 100 mg  100 mg Oral Daily Emokpae, Ejiroghene E, MD   100 mg at 06/28/17 0942  . traMADol  (ULTRAM) tablet 50-100 mg  50-100 mg Oral Q6H PRN Nicholes Stairs, MD   50 mg at 06/28/17 0355  . vitamin B-12 (CYANOCOBALAMIN) tablet 1,000 mcg  1,000 mcg Oral Daily Emokpae, Ejiroghene E, MD   1,000 mcg at 06/28/17 2409     Discharge Medications: Please see discharge summary for a list of discharge medications.  Relevant Imaging Results:  Relevant Lab Results:   Additional Information [id number]  Normajean Baxter, LCSW

## 2017-06-28 NOTE — Social Work (Signed)
CSW discussed bed offers with patient's spouse. She indicated that she will let CSW know. [facility] made bed offers. Spouse did not want [facility] but would f/u with adult children to discuss as that may be the SNF closest to their home.  '  CSW called [facility] and left msg as they did not offer bed through [facility].  CSW will continue to f/u.  Elissa Hefty, LCSW Clinical Social Worker [phone]

## 2017-06-29 ENCOUNTER — Non-Acute Institutional Stay (SKILLED_NURSING_FACILITY): Payer: Medicare Other | Admitting: Internal Medicine

## 2017-06-29 ENCOUNTER — Encounter: Payer: Self-pay | Admitting: Internal Medicine

## 2017-06-29 DIAGNOSIS — S72144S Nondisplaced intertrochanteric fracture of right femur, sequela: Secondary | ICD-10-CM | POA: Diagnosis not present

## 2017-06-29 DIAGNOSIS — E785 Hyperlipidemia, unspecified: Secondary | ICD-10-CM

## 2017-06-29 DIAGNOSIS — I10 Essential (primary) hypertension: Secondary | ICD-10-CM | POA: Diagnosis not present

## 2017-06-29 DIAGNOSIS — R55 Syncope and collapse: Secondary | ICD-10-CM

## 2017-06-29 DIAGNOSIS — D649 Anemia, unspecified: Secondary | ICD-10-CM | POA: Diagnosis not present

## 2017-06-29 DIAGNOSIS — F03918 Unspecified dementia, unspecified severity, with other behavioral disturbance: Secondary | ICD-10-CM | POA: Insufficient documentation

## 2017-06-29 DIAGNOSIS — F0390 Unspecified dementia without behavioral disturbance: Secondary | ICD-10-CM | POA: Diagnosis not present

## 2017-06-29 NOTE — Progress Notes (Signed)
Provider:  Veleta Miners Location:    [place] Room Number: 151/P Place of Service:  SNF (31)  PCP: Rosita Fire, MD Patient Care Team: Rosita Fire, MD as PCP - General (Internal Medicine)  Extended Emergency Contact Information Primary Emergency Contact: Henriquez,Marjorie Address: [address]          [place], [place] [address] [place] Phone: [phone] Relation: Spouse Secondary Emergency Contact: Rucinski,Joan  [place] Phone: [phone] Relation: Daughter  Code Status: Full Code Goals of Care: Advanced Directive information Advanced Directives 06/29/2017  Does Patient Have a Medical Advance Directive? Yes  Type of Advance Directive (No Data)  Does patient want to make changes to medical advance directive? No - Patient declined  Would patient like information on creating a medical advance directive? No - Patient declined      Chief Complaint  Patient presents with  . New Admit To SNF    HPI: Patient is a 83 y.o. male seen today for admission to SNF for Therapy.  Patient has h/o Hypertension, Hyperlipidemia and Hypothyroid and [place] Prostate cancer in 1995  He [place] getting out of the car and was found to have Right Intertrochanteric Fracture of Femur. There was some question of Syncope but the work up including CT scan which kust showed Moderate Atrophy and Calcified Left frontal Meningoma. EEG was normal. Echo showed EF of 60% He underwent Intramedullary Implant on 07/16 Post op Course was uncomplicated and he is discharged to facility for rehab  Most of the history was Obtained from Wife and son. Patient does not remember anything. He and his wife live with their son. Patient can walk with the [place]ford Rile and was independent in his ADL Patient denies any c/o Pain.  Past Medical History:  Diagnosis Date  . Arthritis   . Cancer [facility]) 1995   prostate  . Dementia   . Gall stones   . High cholesterol    . Hypertension   . Renal disorder    Past Surgical History:  Procedure Laterality Date  . CHOLECYSTECTOMY    . FRACTURE SURGERY    . INTRAMEDULLARY (IM) NAIL INTERTROCHANTERIC Right 06/26/2017   Procedure: INTRAMEDULLARY (IM) NAIL INTERTROCHANTRIC;  Surgeon: Nicholes Stairs, MD;  Location: [facility];  Service: Orthopedics;  Laterality: Right;  . [place]   TURP    reports that he quit smoking about 10 years ago. His smoking use included Cigarettes. He has a 85.00 pack-year smoking history. He has quit using smokeless tobacco. His smokeless tobacco use included Snuff and Chew. He reports that he does not drink alcohol or use drugs. Social History   Social History  . Marital status: Married    Spouse name: N/A  . Number of children: N/A  . Years of education: N/A   Occupational History  . Not on file.   Social History Main Topics  . Smoking status: Former Smoker    Packs/day: 1.00    Years: 85.00    Types: Cigarettes    Quit date: 12/12/2006  . Smokeless tobacco: Former Systems developer    Types: Snuff, Chew  . Alcohol use No  . Drug use: No  . Sexual activity: Not on file   Other Topics Concern  . Not on file   Social History Narrative  . No narrative on file    Functional Status Survey:    History reviewed. No pertinent family history.  Health Maintenance  Topic Date Due  .  TETANUS/TDAP  06[birth year]09/1938  . PNA vac Low Risk Adult (1 of 2 - PCV13) 06/09/1984  . INFLUENZA VACCINE  07/12/2017    No Known Allergies  Outpatient Encounter Prescriptions as of 06/29/2017  Medication Sig  . acetaminophen (TYLENOL) 325 MG tablet Take 650 mg by mouth every 6 (six) hours as needed for mild pain.  Marland Kitchen amLODipine (NORVASC) 10 MG tablet Take 10 mg by mouth daily.  Marland Kitchen aspirin EC 81 MG tablet Take 1 tablet (81 mg total) by mouth 2 (two) times daily.  Marland Kitchen atorvastatin (LIPITOR) 10 MG tablet Take 10 mg by mouth daily.  Marland Kitchen docusate sodium (COLACE) 100 MG capsule Take 100 mg by mouth  2 (two) times daily.  Marland Kitchen levothyroxine (SYNTHROID, LEVOTHROID) 150 MCG tablet Take 150 mcg by mouth daily before breakfast.  . sertraline (ZOLOFT) 100 MG tablet Take 100 mg by mouth daily.  Marland Kitchen tetrahydrozoline ([employer] EYE DROPS) 0.05 % ophthalmic solution Place 1-2 drops into both eyes 2 (two) times daily as needed.  . traMADol (ULTRAM) 50 MG tablet Take 1-2 tablets (50-100 mg total) by mouth every 6 (six) hours as needed.  . vitamin B-12 (CYANOCOBALAMIN) 1000 MCG tablet Take 1,000 mcg by mouth daily.  . [DISCONTINUED] traMADol (ULTRAM) 50 MG tablet Take 1 tablet (50 mg total) by mouth every 6 (six) hours as needed for moderate pain or severe pain.   No facility-administered encounter medications on file as of 06/29/2017.     Review of Systems  Unable to perform ROS: Dementia    Vitals:   06/29/17 1016  BP: (!) 118/57  Pulse: 81  Resp: 20  Temp: 97.6 F (36.4 C)  TempSrc: Oral   There is no height or weight on file to calculate BMI. Physical Exam  Constitutional: He appears well-developed and well-nourished.  HENT:  Head: Normocephalic.  Mouth/Throat: Oropharynx is clear and moist.  Eyes: Pupils are equal, round, and reactive to light.  Neck: Neck supple.  Cardiovascular: Normal rate, regular rhythm and normal heart sounds.   No murmur heard. Pulmonary/Chest: Effort normal and breath sounds normal. No respiratory distress. He has no rales.  Abdominal: Soft. Bowel sounds are normal. He exhibits no distension. There is no tenderness. There is no rebound.  Musculoskeletal:  Mild edema B/L  Neurological: He is alert.  Oriented to [place] and [place]. Did not know time or year.Very confused about the events. Good strength in UE Left LE 3/5  Right LE was 2/5  Skin: Skin is warm and dry. No rash noted. No erythema.  Psychiatric: He has a normal mood and affect. His behavior is normal. Thought content normal.    Labs reviewed: Basic Metabolic Panel:  Recent Labs  06/25/17 1421  06/28/17 0325  NA 144 137  K 4.6 4.2  CL 106 106  CO2 28 25  GLUCOSE 110* 108*  BUN 21* 18  CREATININE 1.40* 1.29*  CALCIUM 9.7 9.0   Liver Function Tests: No results for input(s): AST, ALT, ALKPHOS, BILITOT, PROT, ALBUMIN in the last 8760 hours. No results for input(s): LIPASE, AMYLASE in the last 8760 hours. No results for input(s): AMMONIA in the last 8760 hours. CBC:  Recent Labs  06/25/17 1421 06/28/17 0325  WBC 9.1 10.2  NEUTROABS 6.0  --   HGB 12.3* 9.6*  HCT 38.1* 30.1*  MCV 91.4 91.5  PLT 249 199   Cardiac Enzymes:  Recent Labs  06/25/17 1421  TROPONINI <0.03   BNP: Invalid input(s): POCBNP No results found for: HGBA1C No  results found for: TSH No results found for: VITAMINB12 No results found for: FOLATE No results found for: IRON, TIBC, FERRITIN  Imaging and Procedures obtained prior to SNF admission: No results found.  Assessment/Plan Essential hypertension Stable on Amlodipine. His lasix was stopped in the hospital due to Dehydration.  S/P Intramedullary Implant Will start on therapy. Pain seem to be controlled. Cognitive impairment will make therapy difficult.  Dementia  Continue Supportive care  Anemia Most likely Post op. Will Follow CBC  Syncope Will continue to monitor Work up in the hospital was negative.  Hypothyroid Will Check TSH. Contine same dose for now.  Hyperlipidemia Contniue Autocastration  Possible discharge with son and wife  Family/ staff Communication: D/W Son and wife  Labs/tests ordered: CMP, CBC and TSH  Total time spent in this patient care encounter was _45 minutes; greater than 50% of the visit spent counseling family and coordinating care for problems addressed at this encounter.

## 2017-07-03 ENCOUNTER — Encounter (HOSPITAL_COMMUNITY)
Admission: RE | Admit: 2017-07-03 | Discharge: 2017-07-03 | Disposition: A | Discharge status: Home / Self Care | Payer: Medicare Other | Source: Skilled Nursing Facility | Attending: Internal Medicine | Admitting: Internal Medicine

## 2017-07-03 LAB — COMPREHENSIVE METABOLIC PANEL
ALT: 19 U/L (ref 17–63)
ANION GAP: 6 (ref 5–15)
AST: 27 U/L (ref 15–41)
Albumin: 3.1 g/dL — ABNORMAL LOW (ref 3.5–5.0)
Alkaline Phosphatase: 71 U/L (ref 38–126)
BUN: 19 mg/dL (ref 6–20)
CALCIUM: 9.4 mg/dL (ref 8.9–10.3)
CHLORIDE: 106 mmol/L (ref 101–111)
CO2: 26 mmol/L (ref 22–32)
Creatinine, Ser: 1.01 mg/dL (ref 0.61–1.24)
GFR calc non Af Amer: 60 mL/min — ABNORMAL LOW (ref >60)
Glucose, Bld: 92 mg/dL (ref 65–99)
POTASSIUM: 4.6 mmol/L (ref 3.5–5.1)
SODIUM: 138 mmol/L (ref 135–145)
Total Bilirubin: 0.8 mg/dL (ref 0.3–1.2)
Total Protein: 6.7 g/dL (ref 6.5–8.1)

## 2017-07-03 LAB — CBC
HCT: 31.2 % — ABNORMAL LOW (ref 39.0–52.0)
Hemoglobin: 10 g/dL — ABNORMAL LOW (ref 13.0–17.0)
MCH: 29.3 pg (ref 26.0–34.0)
MCHC: 32.1 g/dL (ref 30.0–36.0)
MCV: 91.5 fL (ref 78.0–100.0)
PLATELETS: 344 10*3/uL (ref 150–400)
RBC: 3.41 MIL/uL — ABNORMAL LOW (ref 4.22–5.81)
RDW: 15.4 % (ref 11.5–15.5)
WBC: 8.6 10*3/uL (ref 4.0–10.5)

## 2017-07-03 LAB — TSH: TSH: 0.963 u[IU]/mL (ref 0.350–4.500)

## 2017-07-21 ENCOUNTER — Other Ambulatory Visit: Payer: Self-pay

## 2017-07-21 MED ORDER — TRAMADOL HCL 50 MG PO TABS: 50.0000 mg | ORAL_TABLET | Freq: Four times a day (QID) | ORAL | 0 refills | Status: AC | PRN

## 2017-07-21 NOTE — Telephone Encounter (Signed)
RX Fax for [facility]@ [phone]  

## 2017-07-31 ENCOUNTER — Non-Acute Institutional Stay (SKILLED_NURSING_FACILITY): Payer: Medicare Other | Admitting: Internal Medicine

## 2017-07-31 ENCOUNTER — Encounter: Payer: Self-pay | Admitting: Internal Medicine

## 2017-07-31 DIAGNOSIS — I10 Essential (primary) hypertension: Secondary | ICD-10-CM

## 2017-07-31 DIAGNOSIS — S72144S Nondisplaced intertrochanteric fracture of right femur, sequela: Secondary | ICD-10-CM

## 2017-07-31 DIAGNOSIS — D649 Anemia, unspecified: Secondary | ICD-10-CM

## 2017-07-31 DIAGNOSIS — F0390 Unspecified dementia without behavioral disturbance: Secondary | ICD-10-CM | POA: Diagnosis not present

## 2017-07-31 DIAGNOSIS — E785 Hyperlipidemia, unspecified: Secondary | ICD-10-CM

## 2017-07-31 NOTE — Progress Notes (Signed)
Location:   [place] Room Number: 151/P Place of Service:  SNF (31)  Provider: Granville Lewis  PCP: Rosita Fire, MD Patient Care Team: Rosita Fire, MD as PCP - General (Internal Medicine)  Extended Emergency Contact Information Primary Emergency Contact: Atkison,Marjorie Address: [address]          [place], [place] [address] [place] Phone: [phone] Relation: Spouse Secondary Emergency Contact: [place] Phone: [phone] Relation: Daughter  Code Status: Full Code Goals of care:  Advanced Directive information Advanced Directives 07/31/2017  Does Patient Have a Medical Advance Directive? Yes  Type of Advance Directive (No Data)  Does patient want to make changes to medical advance directive? No - Patient declined  Would patient like information on creating a medical advance directive? No - Patient declined     No Known Allergies  Chief Complaint  Patient presents with  . Discharge Note    Discharge    HPI:  83 y.o. male  seen today for discharge from facility later this week.  He has a history of hypertension hyperlipidemia hypothyroidism and dementia as well as prostate cancer diagnosed in 1995.  He sustained a right femur fracture after getting out of the car-there was a question of syncope First but workup did not really show evidence of this.  He underwent an an intramedullary implant on July 16-postop course was uncomplicated and he was here for rehabilitation.  Stay here is been fairly unremarkable he has made some progress with therapy-he will be going home with his wife as well as son  He will need continued PT and OT as well as home health to assess and treat his multiple medical issues.  He has no complaints today he is a poor historian secondary to dementia.  Regards hypertension this appears stable on Norvasc recent blood pressures 123/48-124/72.  Also history of  hypothyroidism on Synthroid TSH was within normal range at 0.963 on July 23.  In regards to dementia he appears to be doing well with supportive care.  He also has anemia but hemoglobin appears to have stabilized and risen slowly most recently 10.0 will update this before discharge she also has a history of what appears to be some mild renal insufficiency however creatinine appears stable on lab done on July 23 with a creatinine of 1.01 and BUN of 19      Past Medical History:  Diagnosis Date  . Arthritis   . Cancer [facility]) 1995   prostate  . Dementia   . Gall stones   . High cholesterol   . Hypertension   . Renal disorder     Past Surgical History:  Procedure Laterality Date  . CHOLECYSTECTOMY    . FRACTURE SURGERY    . INTRAMEDULLARY (IM) NAIL INTERTROCHANTERIC Right 06/26/2017   Procedure: INTRAMEDULLARY (IM) NAIL INTERTROCHANTRIC;  Surgeon: Nicholes Stairs, MD;  Location: [place];  Service: Orthopedics;  Laterality: Right;  . [place]   TURP      reports that he quit smoking about 10 years ago. His smoking use included Cigarettes. He has a 85.00 pack-year smoking history. He has quit using smokeless tobacco. His smokeless tobacco use included Snuff and Chew. He reports that he does not drink alcohol or use drugs. Social History   Social History  . Marital status: Married    Spouse name: N/A  . Number of children: N/A  . Years of education: N/A  Occupational History  . Not on file.   Social History Main Topics  . Smoking status: Former Smoker    Packs/day: 1.00    Years: 85.00    Types: Cigarettes    Quit date: 12/12/2006  . Smokeless tobacco: Former Systems developer    Types: Snuff, Chew  . Alcohol use No  . Drug use: No  . Sexual activity: Not on file   Other Topics Concern  . Not on file   Social History Narrative  . No narrative on file   Functional Status Survey:    No Known Allergies  Pertinent  Health Maintenance Due  Topic Date Due  .  INFLUENZA VACCINE  11/11/2017 (Originally 07/12/2017)  . PNA vac Low Risk Adult (1 of 2 - PCV13) 11/11/2017 (Originally 06/09/1984)    Medications: Outpatient Encounter Prescriptions as of 07/31/2017  Medication Sig  . acetaminophen (TYLENOL) 325 MG tablet Take 650 mg by mouth every 6 (six) hours as needed for mild pain.  Marland Kitchen amLODipine (NORVASC) 10 MG tablet Take 10 mg by mouth daily.  Marland Kitchen atorvastatin (LIPITOR) 10 MG tablet Take 10 mg by mouth daily.  Marland Kitchen docusate sodium (COLACE) 100 MG capsule Take 100 mg by mouth 2 (two) times daily.  Marland Kitchen levothyroxine (SYNTHROID, LEVOTHROID) 150 MCG tablet Take 150 mcg by mouth daily before breakfast.  . sertraline (ZOLOFT) 100 MG tablet Take 100 mg by mouth daily.  Marland Kitchen tetrahydrozoline ([employer] EYE DROPS) 0.05 % ophthalmic solution Place 1-2 drops into both eyes 2 (two) times daily as needed.  . traMADol (ULTRAM) 50 MG tablet Take 1-2 tablets (50-100 mg total) by mouth every 6 (six) hours as needed.  . vitamin B-12 (CYANOCOBALAMIN) 1000 MCG tablet Take 1,000 mcg by mouth daily.   No facility-administered encounter medications on file as of 07/31/2017.      Review of Systems   \This is quite limited secondary to dementia please see history of present illness nursing staff does not report any acute concerns C denies any chest pain shortness of breath says he has a good appetite denies any abdominal discomfort  He is afebrile pulse 72 respirations 20 blood pressure 123/48 weight is 219.8 says down about 3 pounds since his admission.    Physical Exam In general this is a pleasant well-nourished elderly male who looks younger than his stated age.  His skin is warm and dry.  Eyes visual acuity appears grossly intact he does have an irregular right pupil-sclera and conjunctiva are clear.  Oropharynx is clear mucous membranes moist.  Chest is clear to auscultation there is no labored breathing.  Heart is regular rate and rhythm without murmur gallop or rub-he has  minimal lower extremity edema bilaterally does have reduced pedal pulses.  Abdomen is soft somewhat obese nontender with positive bowel sounds.  Musculoskeletal does move all extremities 4 with some lower extremity weakness largely ambulates in a wheelchair this complicated with his dementia which I suspect limits his ambulatory status.  Neurologic is grossly intact to speech is clear no lateralizing findings.  Psych he is oriented to self pleasant and cooperative follow simple verbal commands but could not tell me Place time date.   Labs reviewed: Basic Metabolic Panel:  Recent Labs  06/25/17 1421 06/28/17 0325 07/03/17 0600  NA 144 137 138  K 4.6 4.2 4.6  CL 106 106 106  CO2 28 25 26   GLUCOSE 110* 108* 92  BUN 21* 18 19  CREATININE 1.40* 1.29* 1.01  CALCIUM 9.7 9.0 9.4  Liver Function Tests:  Recent Labs  07/03/17 0600  AST 27  ALT 19  ALKPHOS 71  BILITOT 0.8  PROT 6.7  ALBUMIN 3.1*   No results for input(s): LIPASE, AMYLASE in the last 8760 hours. No results for input(s): AMMONIA in the last 8760 hours. CBC:  Recent Labs  06/25/17 1421 06/28/17 0325 07/03/17 0600  WBC 9.1 10.2 8.6  NEUTROABS 6.0  --   --   HGB 12.3* 9.6* 10.0*  HCT 38.1* 30.1* 31.2*  MCV 91.4 91.5 91.5  PLT 249 199 344   Cardiac Enzymes:  Recent Labs  06/25/17 1421  TROPONINI <0.03   BNP: Invalid input(s): POCBNP CBG:  Recent Labs  06/27/17 2209 06/28/17 0626 06/28/17 1147  GLUCAP 111* 111* 98    Procedures and Imaging Studies During Stay: No results found.  Assessment/Plan:    #1 history of right femoral fracture with repair-he appears to tolerated this well-- will need continued PT and OT at home-Still has significant weakness complicated with dementia  #2 history hypertension this appears stable and Norvasc as noted above.  #3 hypothyroidism this is stable as well recent TSH was within normal limits will warm follow-up by primary care provider he is on  Synthroid.  #4 history of anemia hemoglobin appears stable at 10.0-will update this before discharge to ensure stability.  #5 renal insufficiency creatinines have been somewhat variable but appears to have stabilized around 1 will update this before discharge as well.  #6 dementia appears to be doing well with supportive care he does have a very supportive son who apparently was a nursing tech in the hospital is retired and lives with his parents   #7 depression-continues on Zoloft-this appears to be stable. He #8 hyperlipidemia continues on a statin since his stay here was quite short will defer any aggressive workup of lipids to his primary care provider  Again he will be going home with family is wife also is quite elderly-but he does have a very supportive son who actually retired from hospital work-he will need home health support cover his multiple medical issues as well as continued PT and OT.  Will update a CBC a metabolic panel before discharge to follow up on renal insufficiency and anemia.  [id number] note greater than 30 minutes spent on this discharge summary-greater than 50% of time spent coordinating plan of care for numerous diagnoses

## 2017-08-01 ENCOUNTER — Encounter (HOSPITAL_COMMUNITY)
Admission: RE | Admit: 2017-08-01 | Discharge: 2017-08-01 | Disposition: A | Discharge status: Home / Self Care | Payer: Medicare Other | Source: Skilled Nursing Facility | Attending: Internal Medicine | Admitting: Internal Medicine

## 2017-08-01 DIAGNOSIS — F0390 Unspecified dementia without behavioral disturbance: Secondary | ICD-10-CM | POA: Insufficient documentation

## 2017-08-01 DIAGNOSIS — Z4789 Encounter for other orthopedic aftercare: Secondary | ICD-10-CM | POA: Insufficient documentation

## 2017-08-01 DIAGNOSIS — E039 Hypothyroidism, unspecified: Secondary | ICD-10-CM | POA: Insufficient documentation

## 2017-08-01 DIAGNOSIS — S72141D Displaced intertrochanteric fracture of right femur, subsequent encounter for closed fracture with routine healing: Secondary | ICD-10-CM | POA: Insufficient documentation

## 2017-08-01 LAB — CBC WITH DIFFERENTIAL/PLATELET
BASOS ABS: 0 10*3/uL (ref 0.0–0.1)
BASOS PCT: 1 %
Eosinophils Absolute: 0.4 10*3/uL (ref 0.0–0.7)
Eosinophils Relative: 6 %
HEMATOCRIT: 35.6 % — AB (ref 39.0–52.0)
HEMOGLOBIN: 11.3 g/dL — AB (ref 13.0–17.0)
LYMPHS PCT: 28 %
Lymphs Abs: 2.2 10*3/uL (ref 0.7–4.0)
MCH: 29.1 pg (ref 26.0–34.0)
MCHC: 31.7 g/dL (ref 30.0–36.0)
MCV: 91.8 fL (ref 78.0–100.0)
Monocytes Absolute: 0.7 10*3/uL (ref 0.1–1.0)
Monocytes Relative: 9 %
NEUTROS ABS: 4.5 10*3/uL (ref 1.7–7.7)
NEUTROS PCT: 56 %
PLATELETS: 275 10*3/uL (ref 150–400)
RBC: 3.88 MIL/uL — AB (ref 4.22–5.81)
RDW: 15.6 % — ABNORMAL HIGH (ref 11.5–15.5)
WBC: 7.9 10*3/uL (ref 4.0–10.5)

## 2017-08-01 LAB — BASIC METABOLIC PANEL
Anion gap: 5 (ref 5–15)
BUN: 18 mg/dL (ref 6–20)
CHLORIDE: 107 mmol/L (ref 101–111)
CO2: 27 mmol/L (ref 22–32)
Calcium: 9.6 mg/dL (ref 8.9–10.3)
Creatinine, Ser: 1.07 mg/dL (ref 0.61–1.24)
GFR calc Af Amer: >60 mL/min (ref >60)
GFR, EST NON AFRICAN AMERICAN: 56 mL/min — AB (ref >60)
GLUCOSE: 96 mg/dL (ref 65–99)
POTASSIUM: 4 mmol/L (ref 3.5–5.1)
Sodium: 139 mmol/L (ref 135–145)

## 2017-08-04 DIAGNOSIS — E039 Hypothyroidism, unspecified: Secondary | ICD-10-CM | POA: Diagnosis not present

## 2017-08-04 DIAGNOSIS — Z87891 Personal history of nicotine dependence: Secondary | ICD-10-CM | POA: Diagnosis not present

## 2017-08-04 DIAGNOSIS — N289 Disorder of kidney and ureter, unspecified: Secondary | ICD-10-CM | POA: Diagnosis not present

## 2017-08-04 DIAGNOSIS — W19XXXD Unspecified fall, subsequent encounter: Secondary | ICD-10-CM | POA: Diagnosis not present

## 2017-08-04 DIAGNOSIS — Z79891 Long term (current) use of opiate analgesic: Secondary | ICD-10-CM | POA: Diagnosis not present

## 2017-08-04 DIAGNOSIS — M1991 Primary osteoarthritis, unspecified site: Secondary | ICD-10-CM | POA: Diagnosis not present

## 2017-08-04 DIAGNOSIS — S72101D Unspecified trochanteric fracture of right femur, subsequent encounter for closed fracture with routine healing: Secondary | ICD-10-CM | POA: Diagnosis not present

## 2017-08-04 DIAGNOSIS — I10 Essential (primary) hypertension: Secondary | ICD-10-CM | POA: Diagnosis not present

## 2017-08-07 DIAGNOSIS — Z79891 Long term (current) use of opiate analgesic: Secondary | ICD-10-CM | POA: Diagnosis not present

## 2017-08-07 DIAGNOSIS — E039 Hypothyroidism, unspecified: Secondary | ICD-10-CM | POA: Diagnosis not present

## 2017-08-07 DIAGNOSIS — Z87891 Personal history of nicotine dependence: Secondary | ICD-10-CM | POA: Diagnosis not present

## 2017-08-07 DIAGNOSIS — M1991 Primary osteoarthritis, unspecified site: Secondary | ICD-10-CM | POA: Diagnosis not present

## 2017-08-07 DIAGNOSIS — N289 Disorder of kidney and ureter, unspecified: Secondary | ICD-10-CM | POA: Diagnosis not present

## 2017-08-07 DIAGNOSIS — I10 Essential (primary) hypertension: Secondary | ICD-10-CM | POA: Diagnosis not present

## 2017-08-07 DIAGNOSIS — W19XXXD Unspecified fall, subsequent encounter: Secondary | ICD-10-CM | POA: Diagnosis not present

## 2017-08-07 DIAGNOSIS — S72101D Unspecified trochanteric fracture of right femur, subsequent encounter for closed fracture with routine healing: Secondary | ICD-10-CM | POA: Diagnosis not present

## 2017-08-08 DIAGNOSIS — S72144D Nondisplaced intertrochanteric fracture of right femur, subsequent encounter for closed fracture with routine healing: Secondary | ICD-10-CM | POA: Diagnosis not present

## 2017-08-09 DIAGNOSIS — I10 Essential (primary) hypertension: Secondary | ICD-10-CM | POA: Diagnosis not present

## 2017-08-09 DIAGNOSIS — N289 Disorder of kidney and ureter, unspecified: Secondary | ICD-10-CM | POA: Diagnosis not present

## 2017-08-09 DIAGNOSIS — E039 Hypothyroidism, unspecified: Secondary | ICD-10-CM | POA: Diagnosis not present

## 2017-08-09 DIAGNOSIS — S72101D Unspecified trochanteric fracture of right femur, subsequent encounter for closed fracture with routine healing: Secondary | ICD-10-CM | POA: Diagnosis not present

## 2017-08-09 DIAGNOSIS — M1991 Primary osteoarthritis, unspecified site: Secondary | ICD-10-CM | POA: Diagnosis not present

## 2017-08-09 DIAGNOSIS — Z79891 Long term (current) use of opiate analgesic: Secondary | ICD-10-CM | POA: Diagnosis not present

## 2017-08-09 DIAGNOSIS — Z87891 Personal history of nicotine dependence: Secondary | ICD-10-CM | POA: Diagnosis not present

## 2017-08-09 DIAGNOSIS — W19XXXD Unspecified fall, subsequent encounter: Secondary | ICD-10-CM | POA: Diagnosis not present

## 2017-08-11 DIAGNOSIS — S72101D Unspecified trochanteric fracture of right femur, subsequent encounter for closed fracture with routine healing: Secondary | ICD-10-CM | POA: Diagnosis not present

## 2017-08-11 DIAGNOSIS — W19XXXD Unspecified fall, subsequent encounter: Secondary | ICD-10-CM | POA: Diagnosis not present

## 2017-08-11 DIAGNOSIS — N289 Disorder of kidney and ureter, unspecified: Secondary | ICD-10-CM | POA: Diagnosis not present

## 2017-08-11 DIAGNOSIS — E039 Hypothyroidism, unspecified: Secondary | ICD-10-CM | POA: Diagnosis not present

## 2017-08-11 DIAGNOSIS — Z79891 Long term (current) use of opiate analgesic: Secondary | ICD-10-CM | POA: Diagnosis not present

## 2017-08-11 DIAGNOSIS — Z87891 Personal history of nicotine dependence: Secondary | ICD-10-CM | POA: Diagnosis not present

## 2017-08-11 DIAGNOSIS — I10 Essential (primary) hypertension: Secondary | ICD-10-CM | POA: Diagnosis not present

## 2017-08-11 DIAGNOSIS — M1991 Primary osteoarthritis, unspecified site: Secondary | ICD-10-CM | POA: Diagnosis not present

## 2017-08-14 DIAGNOSIS — Z87891 Personal history of nicotine dependence: Secondary | ICD-10-CM | POA: Diagnosis not present

## 2017-08-14 DIAGNOSIS — M1991 Primary osteoarthritis, unspecified site: Secondary | ICD-10-CM | POA: Diagnosis not present

## 2017-08-14 DIAGNOSIS — E039 Hypothyroidism, unspecified: Secondary | ICD-10-CM | POA: Diagnosis not present

## 2017-08-14 DIAGNOSIS — I10 Essential (primary) hypertension: Secondary | ICD-10-CM | POA: Diagnosis not present

## 2017-08-14 DIAGNOSIS — W19XXXD Unspecified fall, subsequent encounter: Secondary | ICD-10-CM | POA: Diagnosis not present

## 2017-08-14 DIAGNOSIS — N289 Disorder of kidney and ureter, unspecified: Secondary | ICD-10-CM | POA: Diagnosis not present

## 2017-08-14 DIAGNOSIS — Z79891 Long term (current) use of opiate analgesic: Secondary | ICD-10-CM | POA: Diagnosis not present

## 2017-08-14 DIAGNOSIS — S72101D Unspecified trochanteric fracture of right femur, subsequent encounter for closed fracture with routine healing: Secondary | ICD-10-CM | POA: Diagnosis not present

## 2017-08-15 DIAGNOSIS — N289 Disorder of kidney and ureter, unspecified: Secondary | ICD-10-CM | POA: Diagnosis not present

## 2017-08-15 DIAGNOSIS — M1991 Primary osteoarthritis, unspecified site: Secondary | ICD-10-CM | POA: Diagnosis not present

## 2017-08-15 DIAGNOSIS — Z87891 Personal history of nicotine dependence: Secondary | ICD-10-CM | POA: Diagnosis not present

## 2017-08-15 DIAGNOSIS — W19XXXD Unspecified fall, subsequent encounter: Secondary | ICD-10-CM | POA: Diagnosis not present

## 2017-08-15 DIAGNOSIS — E039 Hypothyroidism, unspecified: Secondary | ICD-10-CM | POA: Diagnosis not present

## 2017-08-15 DIAGNOSIS — I10 Essential (primary) hypertension: Secondary | ICD-10-CM | POA: Diagnosis not present

## 2017-08-15 DIAGNOSIS — Z79891 Long term (current) use of opiate analgesic: Secondary | ICD-10-CM | POA: Diagnosis not present

## 2017-08-15 DIAGNOSIS — S72101D Unspecified trochanteric fracture of right femur, subsequent encounter for closed fracture with routine healing: Secondary | ICD-10-CM | POA: Diagnosis not present

## 2017-08-16 DIAGNOSIS — Z79891 Long term (current) use of opiate analgesic: Secondary | ICD-10-CM | POA: Diagnosis not present

## 2017-08-16 DIAGNOSIS — E039 Hypothyroidism, unspecified: Secondary | ICD-10-CM | POA: Diagnosis not present

## 2017-08-16 DIAGNOSIS — S72101D Unspecified trochanteric fracture of right femur, subsequent encounter for closed fracture with routine healing: Secondary | ICD-10-CM | POA: Diagnosis not present

## 2017-08-16 DIAGNOSIS — Z87891 Personal history of nicotine dependence: Secondary | ICD-10-CM | POA: Diagnosis not present

## 2017-08-16 DIAGNOSIS — I10 Essential (primary) hypertension: Secondary | ICD-10-CM | POA: Diagnosis not present

## 2017-08-16 DIAGNOSIS — N289 Disorder of kidney and ureter, unspecified: Secondary | ICD-10-CM | POA: Diagnosis not present

## 2017-08-16 DIAGNOSIS — M1991 Primary osteoarthritis, unspecified site: Secondary | ICD-10-CM | POA: Diagnosis not present

## 2017-08-16 DIAGNOSIS — W19XXXD Unspecified fall, subsequent encounter: Secondary | ICD-10-CM | POA: Diagnosis not present

## 2017-08-18 DIAGNOSIS — M1991 Primary osteoarthritis, unspecified site: Secondary | ICD-10-CM | POA: Diagnosis not present

## 2017-08-18 DIAGNOSIS — W19XXXD Unspecified fall, subsequent encounter: Secondary | ICD-10-CM | POA: Diagnosis not present

## 2017-08-18 DIAGNOSIS — E039 Hypothyroidism, unspecified: Secondary | ICD-10-CM | POA: Diagnosis not present

## 2017-08-18 DIAGNOSIS — Z79891 Long term (current) use of opiate analgesic: Secondary | ICD-10-CM | POA: Diagnosis not present

## 2017-08-18 DIAGNOSIS — I10 Essential (primary) hypertension: Secondary | ICD-10-CM | POA: Diagnosis not present

## 2017-08-18 DIAGNOSIS — Z87891 Personal history of nicotine dependence: Secondary | ICD-10-CM | POA: Diagnosis not present

## 2017-08-18 DIAGNOSIS — N289 Disorder of kidney and ureter, unspecified: Secondary | ICD-10-CM | POA: Diagnosis not present

## 2017-08-18 DIAGNOSIS — S72101D Unspecified trochanteric fracture of right femur, subsequent encounter for closed fracture with routine healing: Secondary | ICD-10-CM | POA: Diagnosis not present

## 2017-08-21 DIAGNOSIS — Z87891 Personal history of nicotine dependence: Secondary | ICD-10-CM | POA: Diagnosis not present

## 2017-08-21 DIAGNOSIS — I10 Essential (primary) hypertension: Secondary | ICD-10-CM | POA: Diagnosis not present

## 2017-08-21 DIAGNOSIS — W19XXXD Unspecified fall, subsequent encounter: Secondary | ICD-10-CM | POA: Diagnosis not present

## 2017-08-21 DIAGNOSIS — Z79891 Long term (current) use of opiate analgesic: Secondary | ICD-10-CM | POA: Diagnosis not present

## 2017-08-21 DIAGNOSIS — E039 Hypothyroidism, unspecified: Secondary | ICD-10-CM | POA: Diagnosis not present

## 2017-08-21 DIAGNOSIS — M1991 Primary osteoarthritis, unspecified site: Secondary | ICD-10-CM | POA: Diagnosis not present

## 2017-08-21 DIAGNOSIS — N289 Disorder of kidney and ureter, unspecified: Secondary | ICD-10-CM | POA: Diagnosis not present

## 2017-08-21 DIAGNOSIS — S72101D Unspecified trochanteric fracture of right femur, subsequent encounter for closed fracture with routine healing: Secondary | ICD-10-CM | POA: Diagnosis not present

## 2017-08-22 DIAGNOSIS — N289 Disorder of kidney and ureter, unspecified: Secondary | ICD-10-CM | POA: Diagnosis not present

## 2017-08-22 DIAGNOSIS — Z23 Encounter for immunization: Secondary | ICD-10-CM | POA: Diagnosis not present

## 2017-08-22 DIAGNOSIS — E039 Hypothyroidism, unspecified: Secondary | ICD-10-CM | POA: Diagnosis not present

## 2017-08-22 DIAGNOSIS — W19XXXD Unspecified fall, subsequent encounter: Secondary | ICD-10-CM | POA: Diagnosis not present

## 2017-08-22 DIAGNOSIS — M1991 Primary osteoarthritis, unspecified site: Secondary | ICD-10-CM | POA: Diagnosis not present

## 2017-08-22 DIAGNOSIS — Z79891 Long term (current) use of opiate analgesic: Secondary | ICD-10-CM | POA: Diagnosis not present

## 2017-08-22 DIAGNOSIS — Z87891 Personal history of nicotine dependence: Secondary | ICD-10-CM | POA: Diagnosis not present

## 2017-08-22 DIAGNOSIS — I10 Essential (primary) hypertension: Secondary | ICD-10-CM | POA: Diagnosis not present

## 2017-08-22 DIAGNOSIS — S72101D Unspecified trochanteric fracture of right femur, subsequent encounter for closed fracture with routine healing: Secondary | ICD-10-CM | POA: Diagnosis not present

## 2017-08-24 DIAGNOSIS — E039 Hypothyroidism, unspecified: Secondary | ICD-10-CM | POA: Diagnosis not present

## 2017-08-24 DIAGNOSIS — M1991 Primary osteoarthritis, unspecified site: Secondary | ICD-10-CM | POA: Diagnosis not present

## 2017-08-24 DIAGNOSIS — I10 Essential (primary) hypertension: Secondary | ICD-10-CM | POA: Diagnosis not present

## 2017-08-24 DIAGNOSIS — N289 Disorder of kidney and ureter, unspecified: Secondary | ICD-10-CM | POA: Diagnosis not present

## 2017-08-24 DIAGNOSIS — Z87891 Personal history of nicotine dependence: Secondary | ICD-10-CM | POA: Diagnosis not present

## 2017-08-24 DIAGNOSIS — S72101D Unspecified trochanteric fracture of right femur, subsequent encounter for closed fracture with routine healing: Secondary | ICD-10-CM | POA: Diagnosis not present

## 2017-08-24 DIAGNOSIS — Z79891 Long term (current) use of opiate analgesic: Secondary | ICD-10-CM | POA: Diagnosis not present

## 2017-08-24 DIAGNOSIS — W19XXXD Unspecified fall, subsequent encounter: Secondary | ICD-10-CM | POA: Diagnosis not present

## 2017-09-04 DIAGNOSIS — S72101A Unspecified trochanteric fracture of right femur, initial encounter for closed fracture: Secondary | ICD-10-CM | POA: Diagnosis not present

## 2017-10-04 DIAGNOSIS — S72144D Nondisplaced intertrochanteric fracture of right femur, subsequent encounter for closed fracture with routine healing: Secondary | ICD-10-CM | POA: Diagnosis not present

## 2018-03-20 ENCOUNTER — Ambulatory Visit: Payer: Medicare Other | Admitting: Urology

## 2018-03-20 DIAGNOSIS — Z8546 Personal history of malignant neoplasm of prostate: Secondary | ICD-10-CM | POA: Diagnosis not present

## 2018-03-20 DIAGNOSIS — R32 Unspecified urinary incontinence: Secondary | ICD-10-CM | POA: Diagnosis not present

## 2018-06-27 IMAGING — DX DG KNEE COMPLETE 4+V*R*
2 series · 2 of 2 positions shown · non-contrast
Comparison: None.

CLINICAL DATA: Right knee pain after a fall today.

EXAM:
RIGHT KNEE - COMPLETE 4+ VIEW

[knee lat]
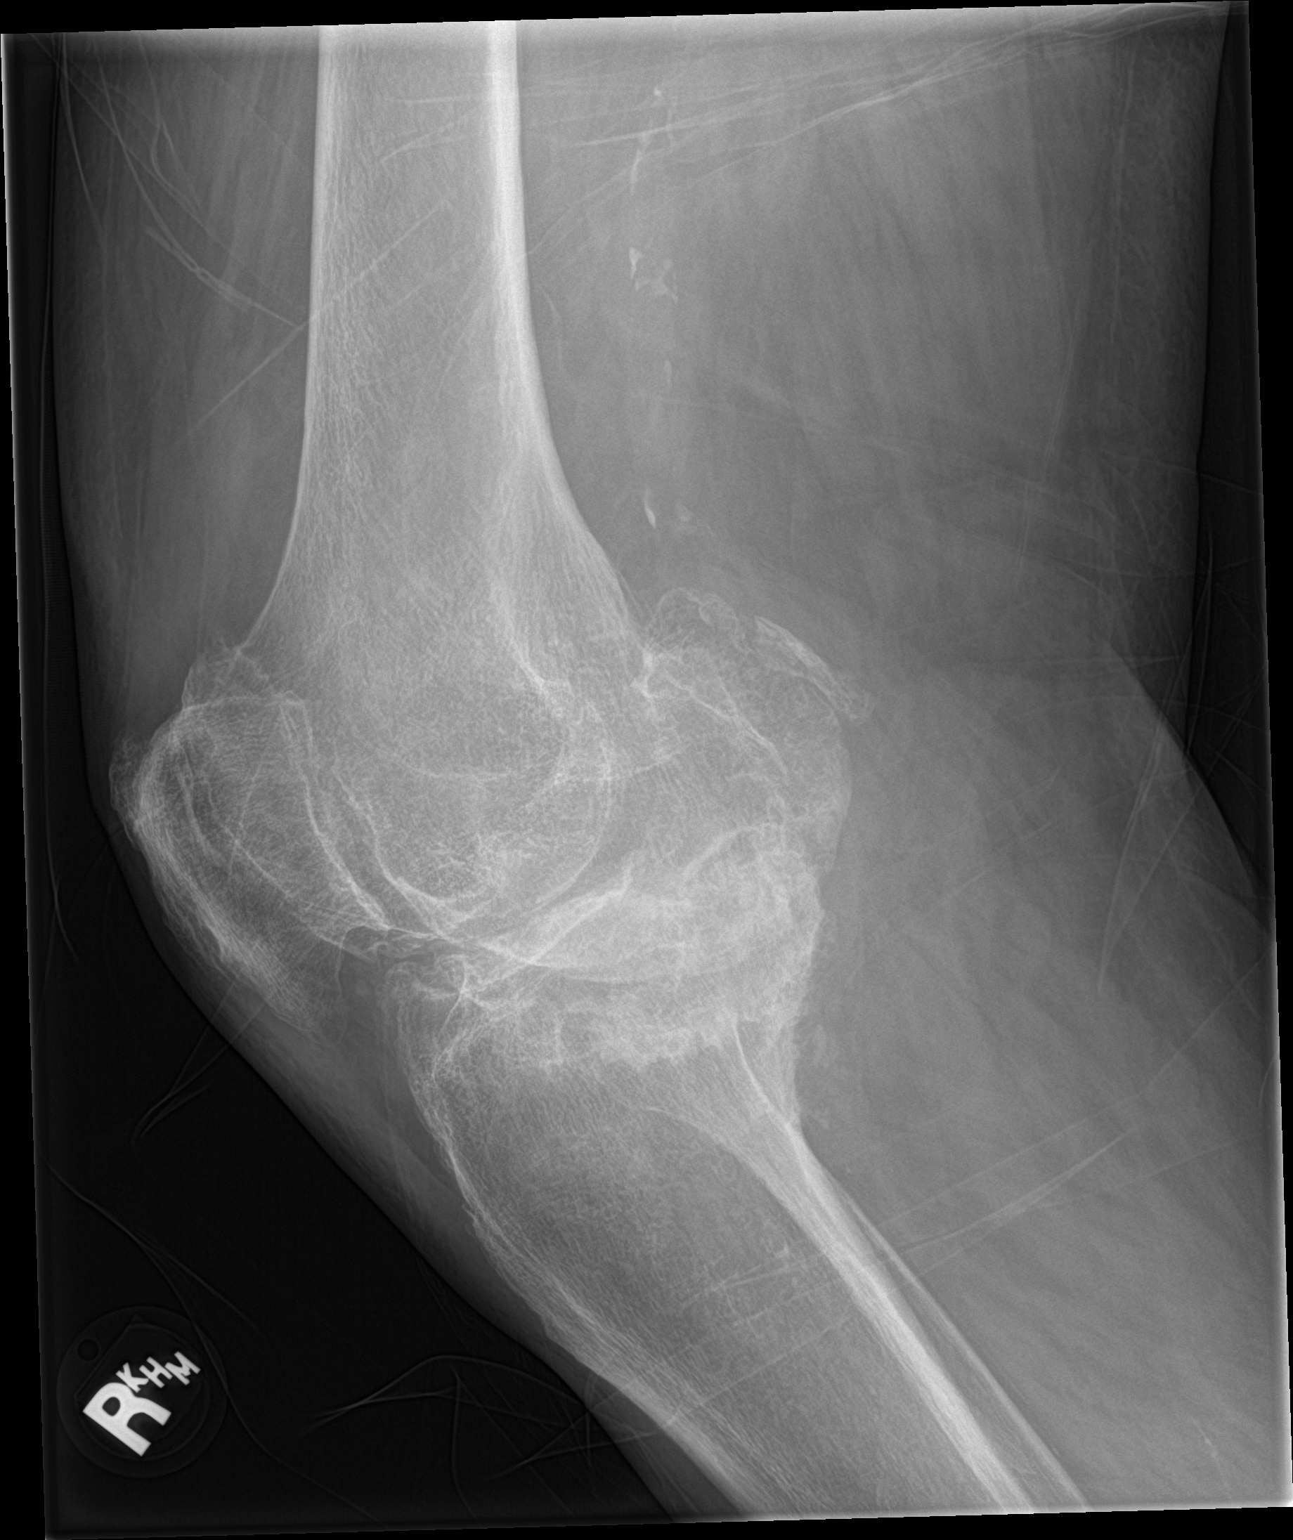

[knee ap]
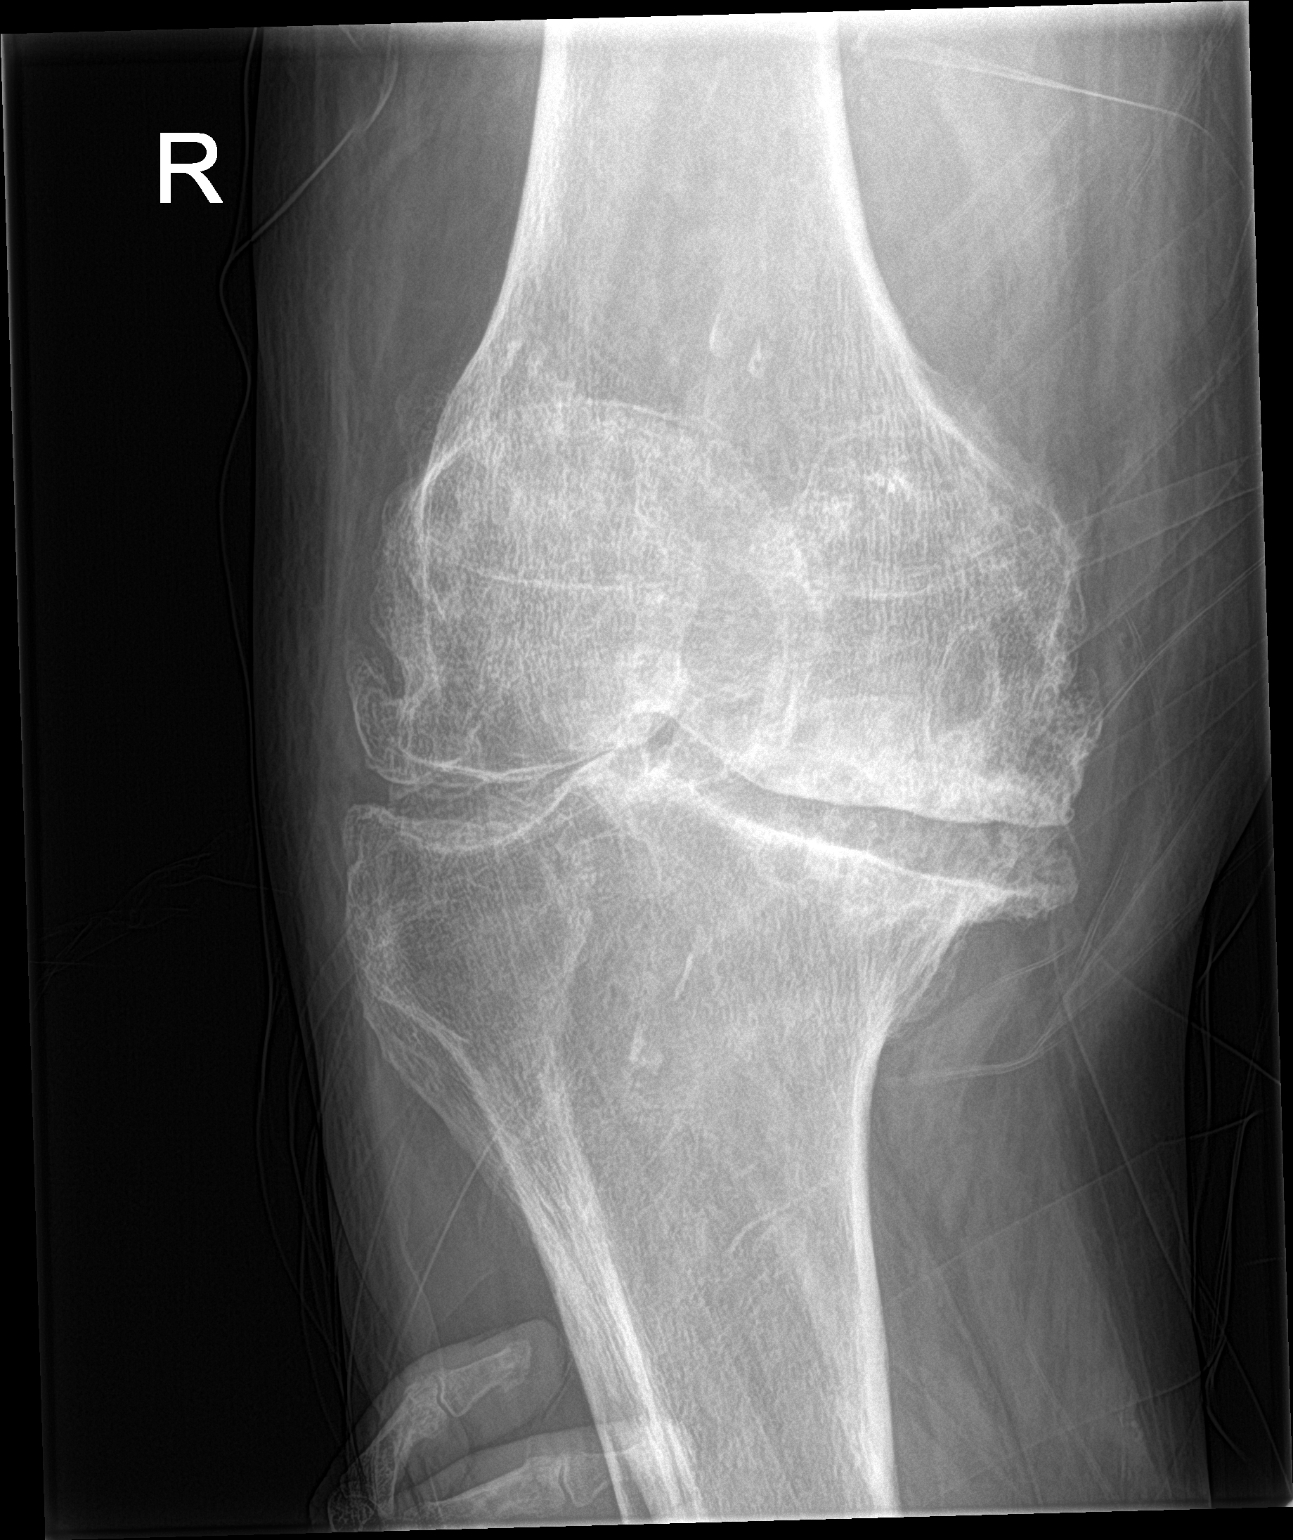

[2 of 2 positions shown; findings below may reference images not displayed]

FINDINGS: AP and lateral views of the right knee are limited by on difficulty
positioning due to a right hip fracture. There are marked
tricompartmental degenerative changes. There is a small effusion. No
visible fracture or dislocation. Atheromatous arterial
calcifications.
IMPRESSION: 1. Limited examination with no visible fracture or dislocation.
2. Extensive tricompartmental degenerative changes and small
effusion.

## 2018-06-27 IMAGING — DX DG CHEST 1V
1 series · 1 of 1 positions shown · non-contrast
Comparison: 01/26/2017 chest CT

CLINICAL DATA: Fall today

EXAM:
CHEST 1 VIEW

[chest ap]
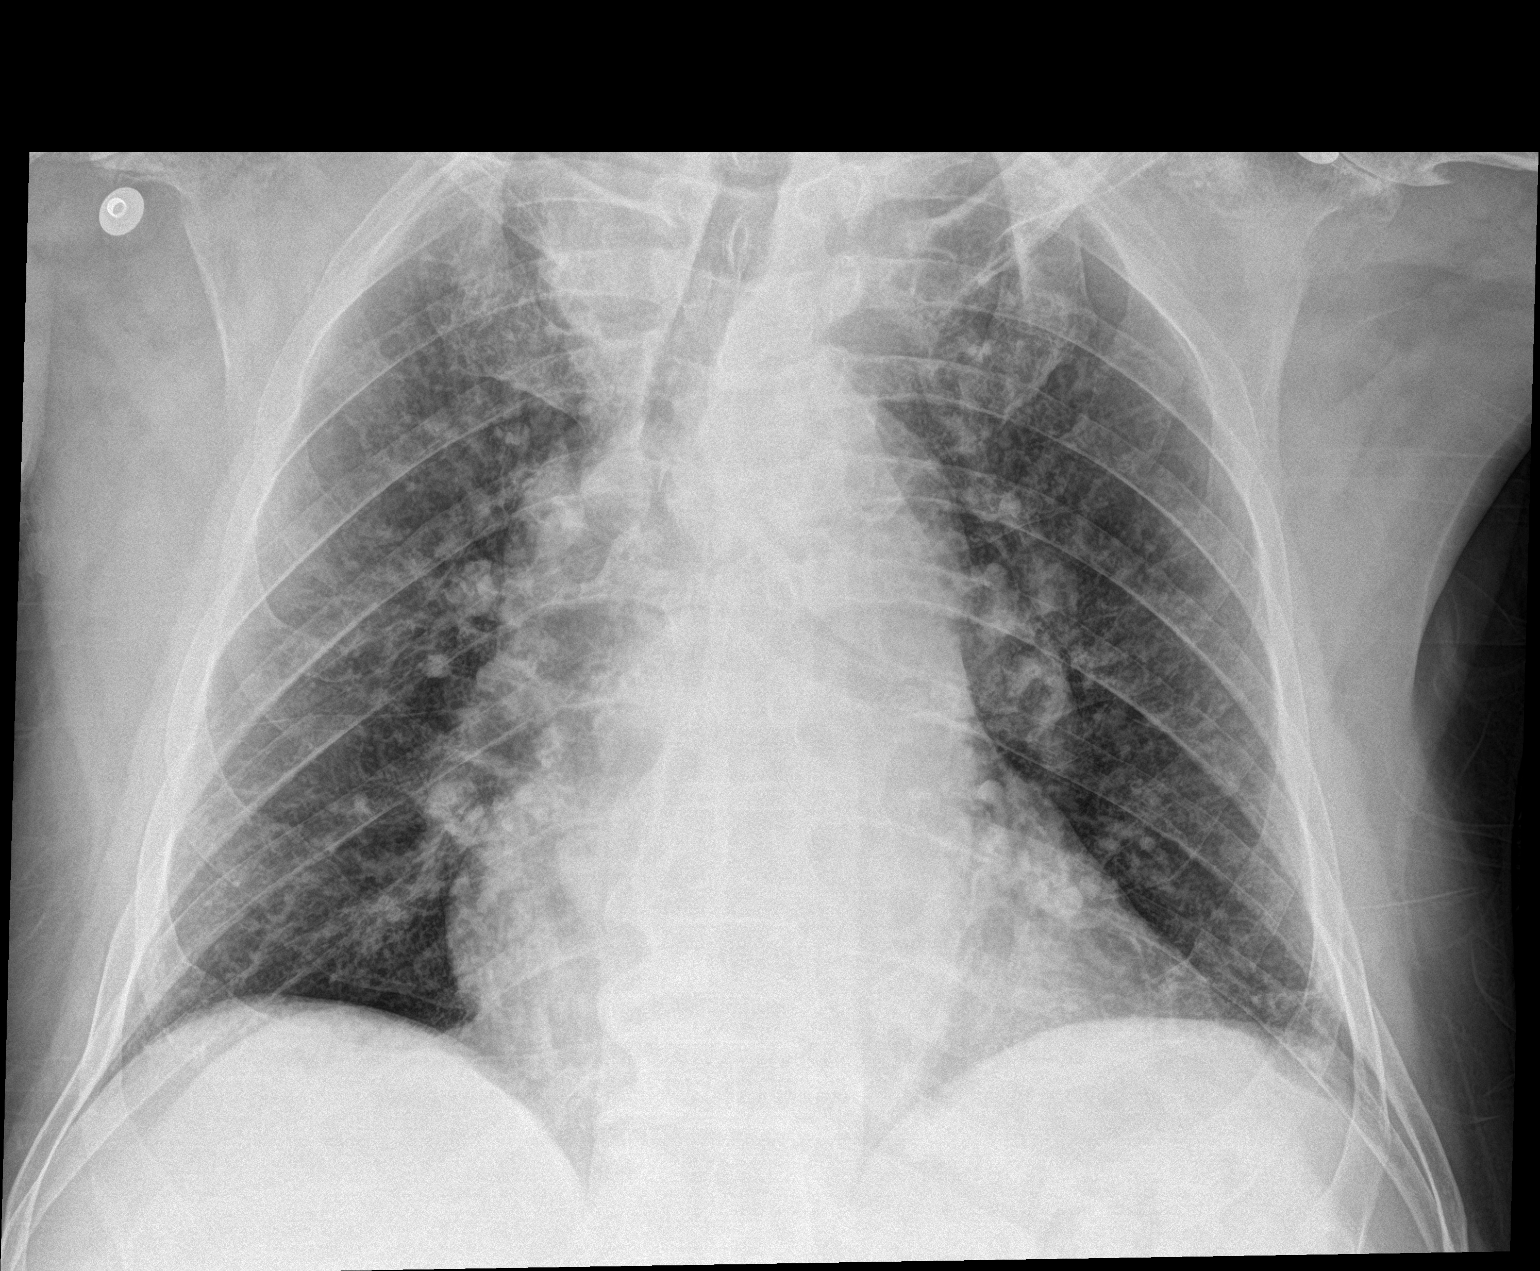

[1 of 1 positions shown; findings below may reference images not displayed]

FINDINGS: Top-normal heart size. Mildly tortuous atherosclerotic thoracic
aorta. Otherwise stable mediastinal contour with superior
mediastinal widening due to known cord are. No pneumothorax. No
pleural effusion. Slightly low lung volumes with vascular crowding,
without overt pulmonary edema. No acute consolidative airspace
disease.
IMPRESSION: No active cardiopulmonary disease.

## 2018-06-27 IMAGING — DX DG FEMUR 2+V*R*
3 series · 3 of 3 positions shown · non-contrast
Comparison: Right hip and right knee radiographs from earlier
today.

CLINICAL DATA: Fall today. Intertrochanteric right proximal femur
fracture.

EXAM:
RIGHT FEMUR 2 VIEWS

[femur ap]
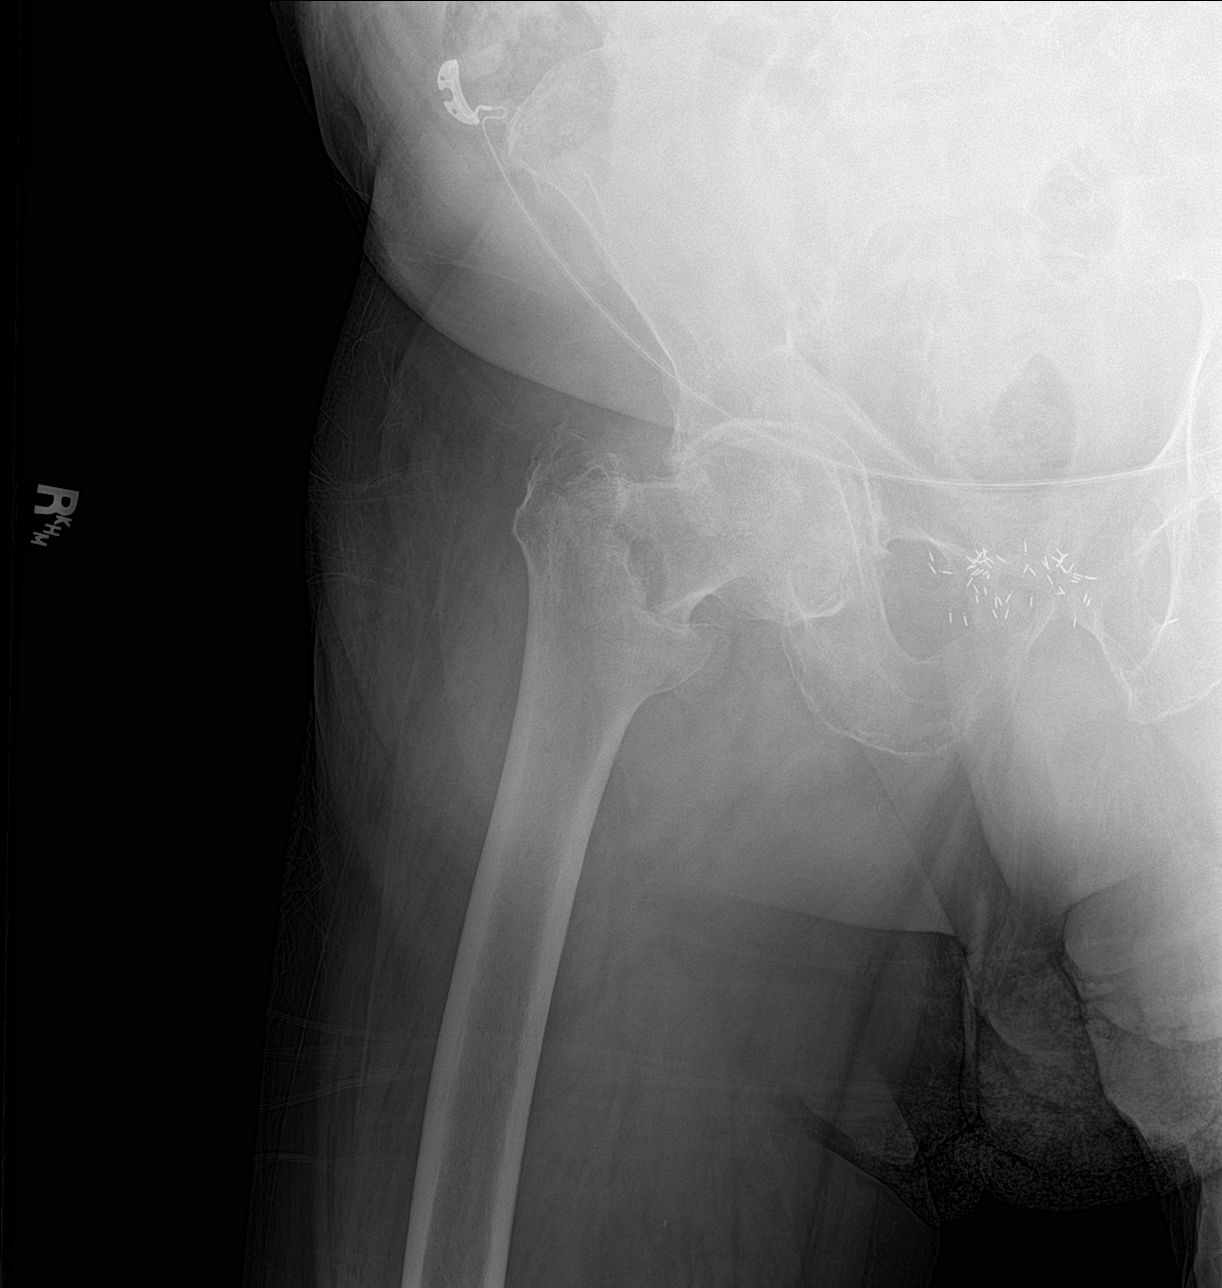

[femur lat (1 of 2)]
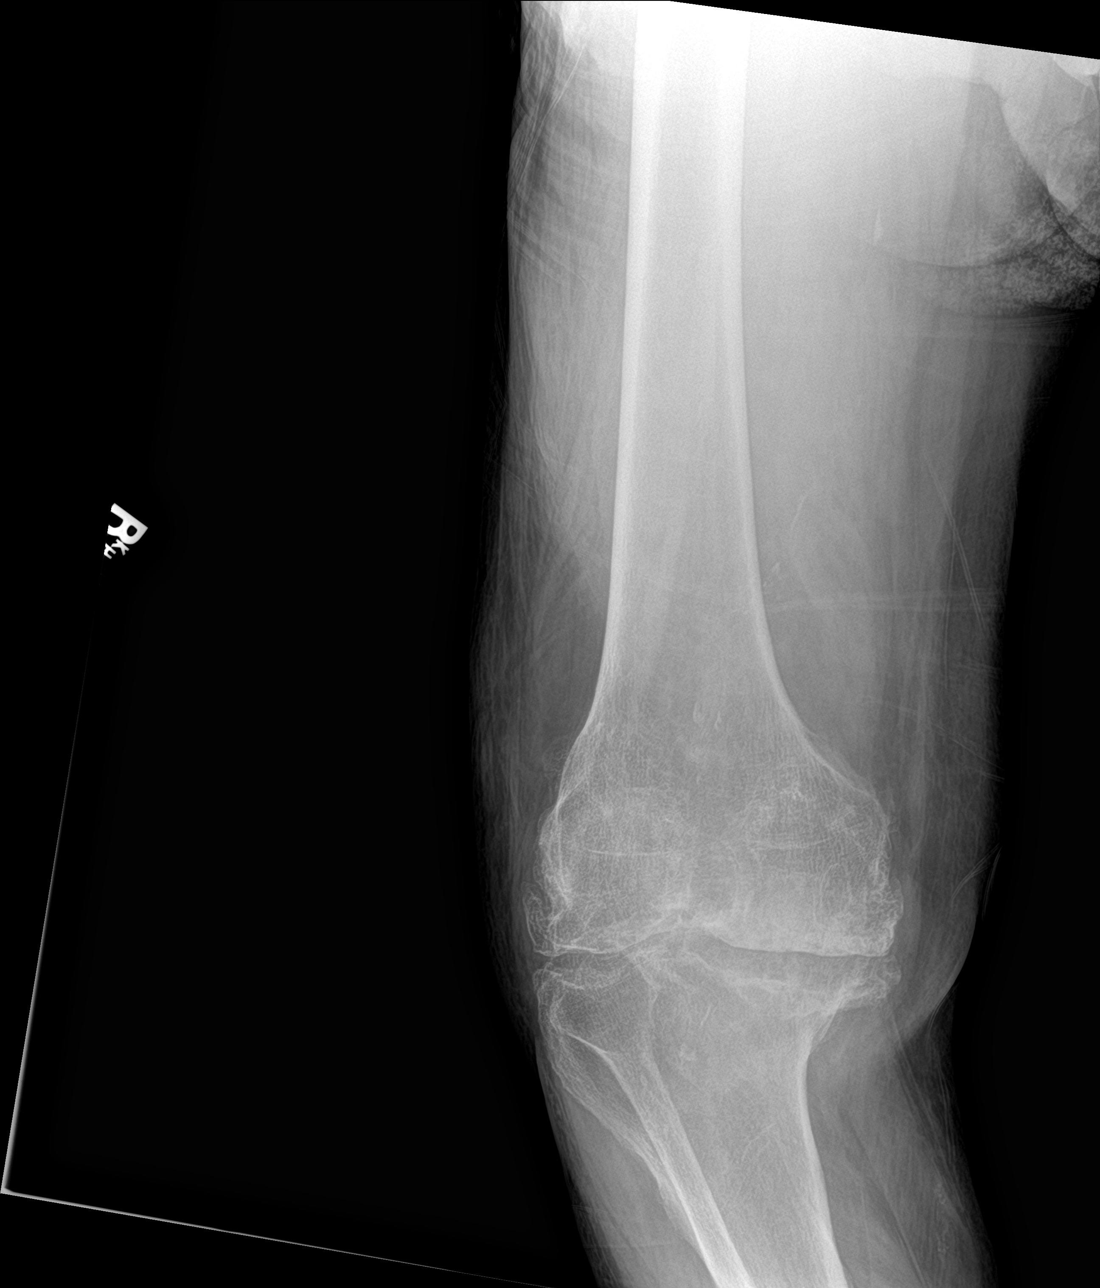

[femur lat (2 of 2)]
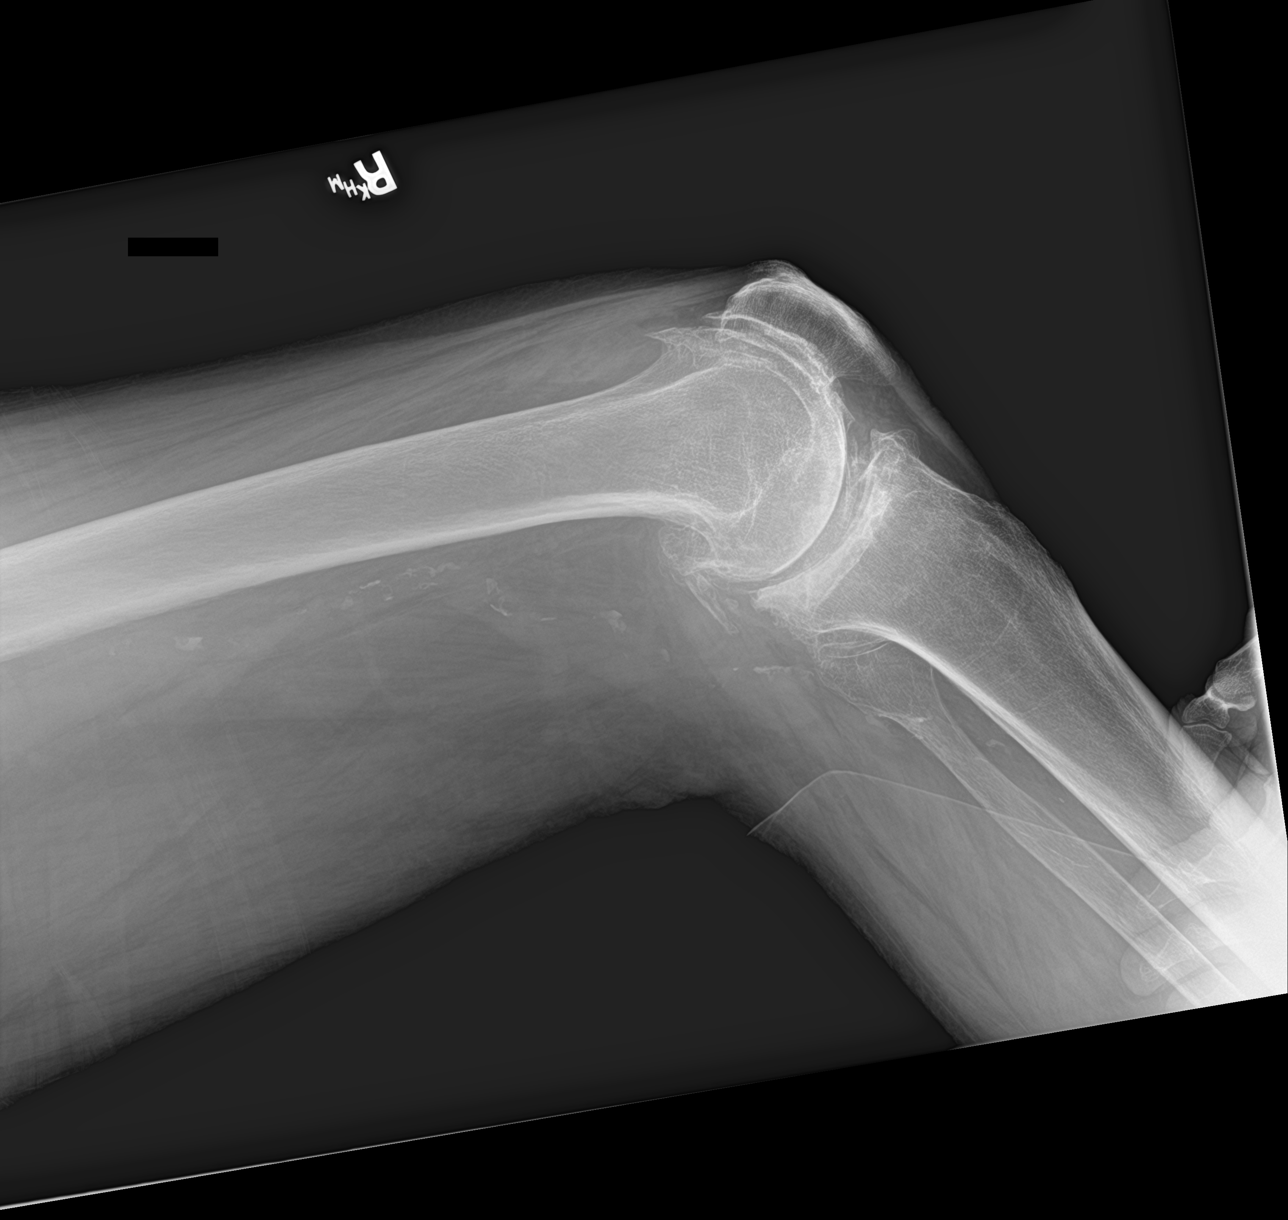

[3 of 3 positions shown; findings below may reference images not displayed]

FINDINGS: Please note that the proximal [DATE] of the right femur are not imaged
in a lateral view due to patient's inability to cooperate due to
discomfort. Re- demonstration of mildly impacted intertrochanteric
right proximal femur fracture without significant displacement. No
additional fracture. No evidence of dislocation at the right hip or
right knee. No suspicious focal osseous lesions. Severe
tricompartmental right knee osteoarthritis. Moderate right hip
osteoarthritis. No radiopaque foreign body. Brachytherapy seeds
overlie the location of the prostate.
IMPRESSION: Re- demonstration of intertrochanteric right proximal femur
fracture. No additional fracture, with limitations as described.

## 2018-06-28 IMAGING — RF DG HIP (WITH PELVIS) OPERATIVE*R*
1 series · 4 of 4 positions shown · non-contrast
Comparison: Radiography from yesterday

CLINICAL DATA: Right femoral nail.

EXAM:
DG C-ARM 61-120 MIN; OPERATIVE RIGHT HIP WITH PELVIS

[Series 1: run · 4 of 4 slices shown]
[im 1/4]
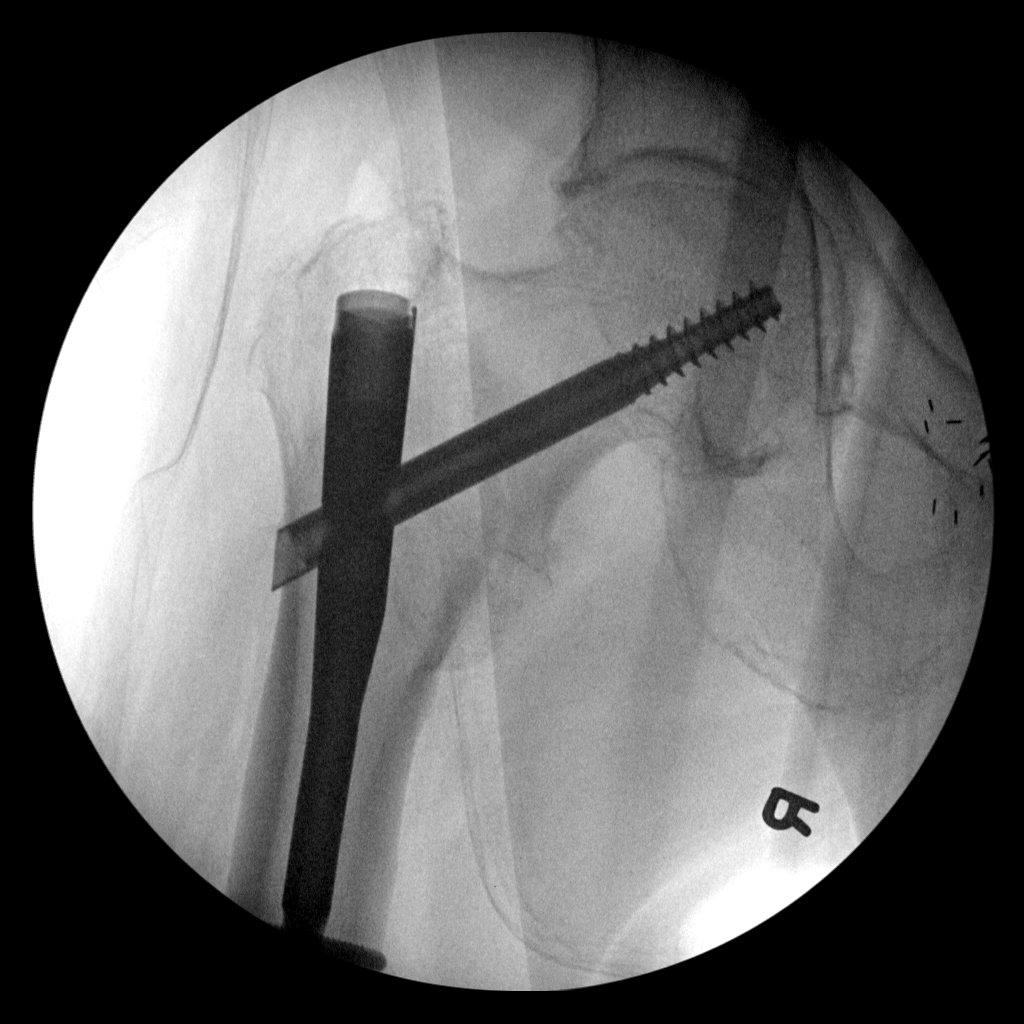
[im 2/4]
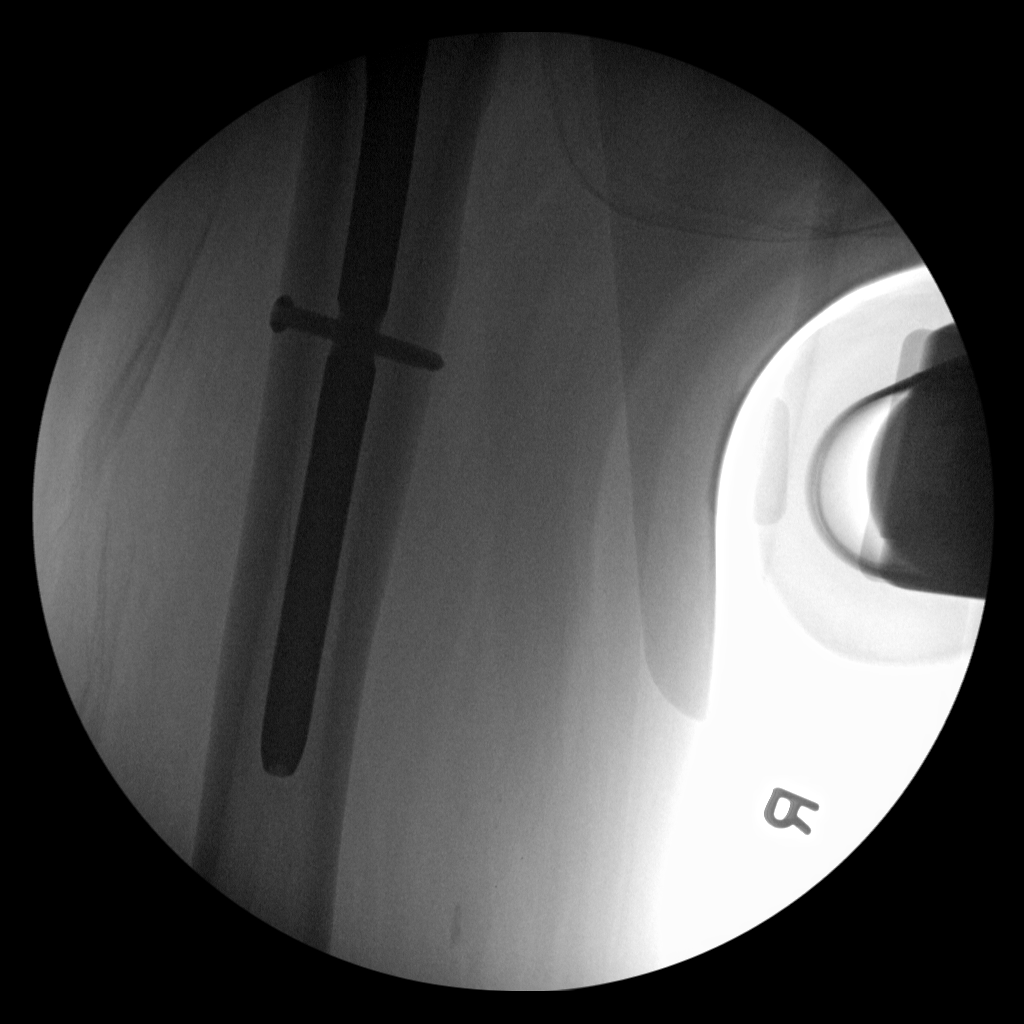
[im 3/4]
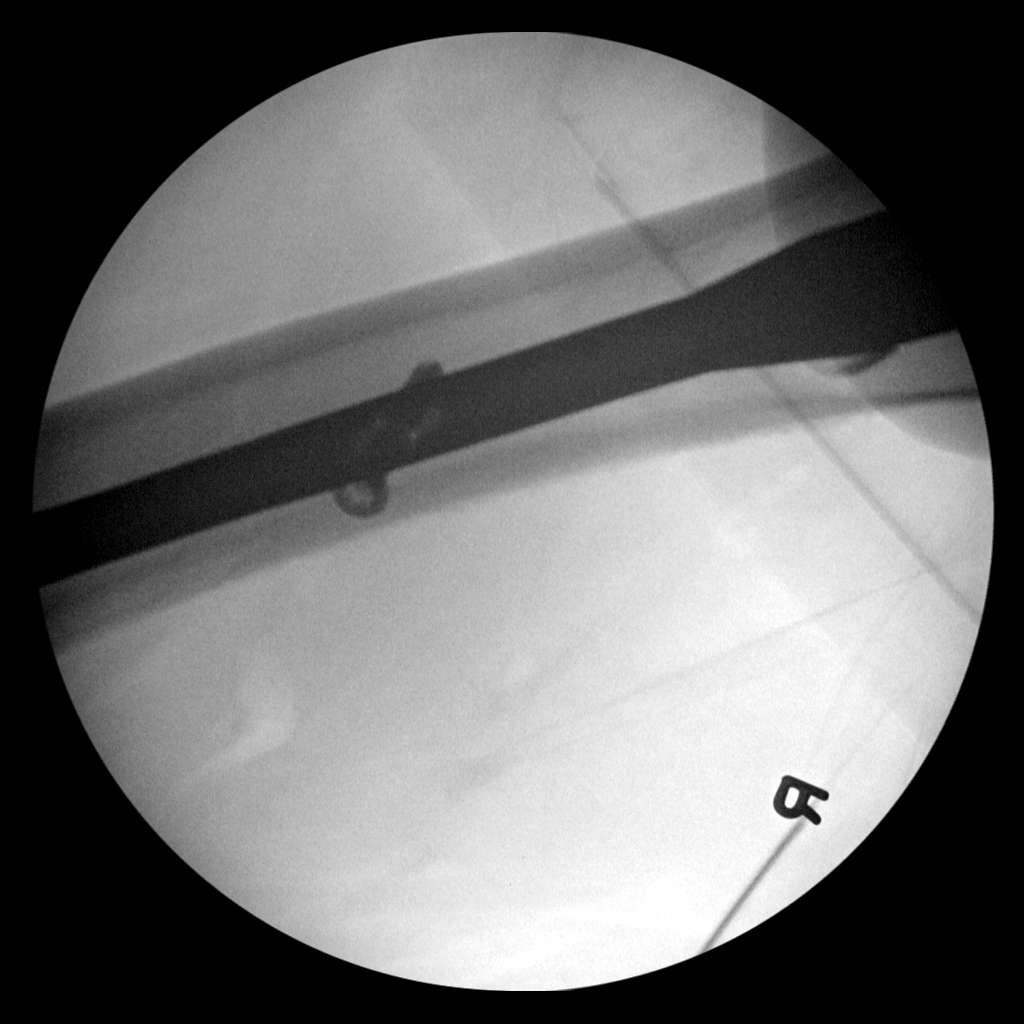
[im 4/4]
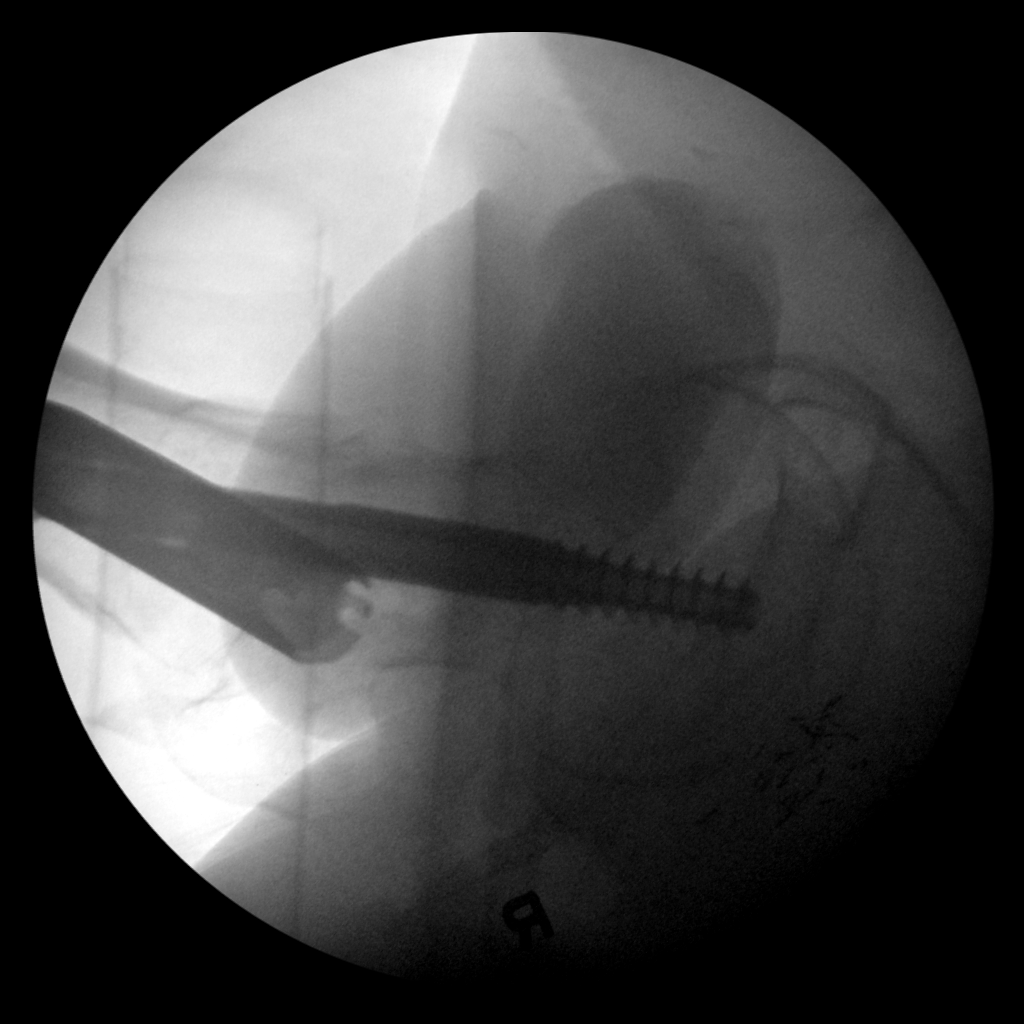

[4 of 4 positions shown; findings below may reference images not displayed]

FINDINGS: AP and lateral intraoperative fluoroscopic images show placement of
a dynamic hip screw and short femoral nail. No evidence of
intraoperative fracture or hardware displacement.
IMPRESSION: Fluoroscopy for proximal right femur fracture ORIF. No unexpected
finding.

## 2018-08-14 DIAGNOSIS — E785 Hyperlipidemia, unspecified: Secondary | ICD-10-CM | POA: Diagnosis not present

## 2018-08-14 DIAGNOSIS — R739 Hyperglycemia, unspecified: Secondary | ICD-10-CM | POA: Diagnosis not present

## 2018-08-14 DIAGNOSIS — Z1389 Encounter for screening for other disorder: Secondary | ICD-10-CM | POA: Diagnosis not present

## 2018-08-14 DIAGNOSIS — I10 Essential (primary) hypertension: Secondary | ICD-10-CM | POA: Diagnosis not present

## 2018-08-14 DIAGNOSIS — K5900 Constipation, unspecified: Secondary | ICD-10-CM | POA: Diagnosis not present

## 2018-08-14 DIAGNOSIS — E039 Hypothyroidism, unspecified: Secondary | ICD-10-CM | POA: Diagnosis not present

## 2018-08-14 DIAGNOSIS — Z0001 Encounter for general adult medical examination with abnormal findings: Secondary | ICD-10-CM | POA: Diagnosis not present

## 2018-08-20 DIAGNOSIS — I10 Essential (primary) hypertension: Secondary | ICD-10-CM | POA: Diagnosis not present

## 2018-08-20 DIAGNOSIS — E039 Hypothyroidism, unspecified: Secondary | ICD-10-CM | POA: Diagnosis not present

## 2018-08-20 DIAGNOSIS — R2681 Unsteadiness on feet: Secondary | ICD-10-CM | POA: Diagnosis not present

## 2018-08-20 DIAGNOSIS — G4700 Insomnia, unspecified: Secondary | ICD-10-CM | POA: Diagnosis not present

## 2018-11-12 DIAGNOSIS — I10 Essential (primary) hypertension: Secondary | ICD-10-CM | POA: Diagnosis not present

## 2018-11-12 DIAGNOSIS — M1990 Unspecified osteoarthritis, unspecified site: Secondary | ICD-10-CM | POA: Diagnosis not present

## 2018-11-12 DIAGNOSIS — E039 Hypothyroidism, unspecified: Secondary | ICD-10-CM | POA: Diagnosis not present

## 2019-01-28 ENCOUNTER — Ambulatory Visit (INDEPENDENT_AMBULATORY_CARE_PROVIDER_SITE_OTHER): Payer: Medicare Other | Admitting: Otolaryngology

## 2019-01-28 DIAGNOSIS — H903 Sensorineural hearing loss, bilateral: Secondary | ICD-10-CM

## 2019-01-28 DIAGNOSIS — H6123 Impacted cerumen, bilateral: Secondary | ICD-10-CM

## 2019-02-11 DIAGNOSIS — M1990 Unspecified osteoarthritis, unspecified site: Secondary | ICD-10-CM | POA: Diagnosis not present

## 2019-02-11 DIAGNOSIS — Z72820 Sleep deprivation: Secondary | ICD-10-CM | POA: Diagnosis not present

## 2019-02-11 DIAGNOSIS — I10 Essential (primary) hypertension: Secondary | ICD-10-CM | POA: Diagnosis not present

## 2019-05-13 ENCOUNTER — Other Ambulatory Visit: Payer: Self-pay

## 2019-05-13 ENCOUNTER — Other Ambulatory Visit (HOSPITAL_COMMUNITY): Payer: Self-pay | Admitting: Internal Medicine

## 2019-05-13 ENCOUNTER — Ambulatory Visit (HOSPITAL_COMMUNITY)
Admission: RE | Admit: 2019-05-13 | Discharge: 2019-05-13 | Disposition: A | Discharge status: Home / Self Care | Payer: Medicare Other | Source: Ambulatory Visit | Attending: Internal Medicine | Admitting: Internal Medicine

## 2019-05-13 DIAGNOSIS — R05 Cough: Secondary | ICD-10-CM

## 2019-05-13 DIAGNOSIS — M1990 Unspecified osteoarthritis, unspecified site: Secondary | ICD-10-CM | POA: Diagnosis not present

## 2019-05-13 DIAGNOSIS — R053 Chronic cough: Secondary | ICD-10-CM

## 2019-05-13 DIAGNOSIS — I10 Essential (primary) hypertension: Secondary | ICD-10-CM | POA: Diagnosis not present

## 2019-05-13 DIAGNOSIS — E039 Hypothyroidism, unspecified: Secondary | ICD-10-CM | POA: Diagnosis not present

## 2019-05-13 DIAGNOSIS — R079 Chest pain, unspecified: Secondary | ICD-10-CM | POA: Diagnosis not present

## 2019-06-12 DIAGNOSIS — I10 Essential (primary) hypertension: Secondary | ICD-10-CM | POA: Diagnosis not present

## 2019-06-12 DIAGNOSIS — E039 Hypothyroidism, unspecified: Secondary | ICD-10-CM | POA: Diagnosis not present

## 2019-08-06 DIAGNOSIS — E785 Hyperlipidemia, unspecified: Secondary | ICD-10-CM | POA: Diagnosis not present

## 2019-08-06 DIAGNOSIS — I10 Essential (primary) hypertension: Secondary | ICD-10-CM | POA: Diagnosis not present

## 2019-09-06 DIAGNOSIS — E785 Hyperlipidemia, unspecified: Secondary | ICD-10-CM | POA: Diagnosis not present

## 2019-09-06 DIAGNOSIS — I10 Essential (primary) hypertension: Secondary | ICD-10-CM | POA: Diagnosis not present

## 2019-09-19 DIAGNOSIS — I10 Essential (primary) hypertension: Secondary | ICD-10-CM | POA: Diagnosis not present

## 2019-09-19 DIAGNOSIS — Z1389 Encounter for screening for other disorder: Secondary | ICD-10-CM | POA: Diagnosis not present

## 2019-09-19 DIAGNOSIS — E039 Hypothyroidism, unspecified: Secondary | ICD-10-CM | POA: Diagnosis not present

## 2019-09-19 DIAGNOSIS — Z0001 Encounter for general adult medical examination with abnormal findings: Secondary | ICD-10-CM | POA: Diagnosis not present

## 2019-09-24 DIAGNOSIS — Z0001 Encounter for general adult medical examination with abnormal findings: Secondary | ICD-10-CM | POA: Diagnosis not present

## 2019-09-24 DIAGNOSIS — I10 Essential (primary) hypertension: Secondary | ICD-10-CM | POA: Diagnosis not present

## 2019-09-24 DIAGNOSIS — E039 Hypothyroidism, unspecified: Secondary | ICD-10-CM | POA: Diagnosis not present

## 2019-09-24 DIAGNOSIS — E785 Hyperlipidemia, unspecified: Secondary | ICD-10-CM | POA: Diagnosis not present

## 2019-10-20 DIAGNOSIS — I10 Essential (primary) hypertension: Secondary | ICD-10-CM | POA: Diagnosis not present

## 2019-10-20 DIAGNOSIS — E039 Hypothyroidism, unspecified: Secondary | ICD-10-CM | POA: Diagnosis not present

## 2019-11-19 DIAGNOSIS — I10 Essential (primary) hypertension: Secondary | ICD-10-CM | POA: Diagnosis not present

## 2019-11-19 DIAGNOSIS — E785 Hyperlipidemia, unspecified: Secondary | ICD-10-CM | POA: Diagnosis not present

## 2019-12-20 DIAGNOSIS — E039 Hypothyroidism, unspecified: Secondary | ICD-10-CM | POA: Diagnosis not present

## 2019-12-20 DIAGNOSIS — I10 Essential (primary) hypertension: Secondary | ICD-10-CM | POA: Diagnosis not present

## 2020-01-20 ENCOUNTER — Other Ambulatory Visit (HOSPITAL_COMMUNITY): Payer: Self-pay | Admitting: Internal Medicine

## 2020-01-20 ENCOUNTER — Other Ambulatory Visit: Payer: Self-pay

## 2020-01-20 ENCOUNTER — Ambulatory Visit (HOSPITAL_COMMUNITY)
Admission: RE | Admit: 2020-01-20 | Discharge: 2020-01-20 | Disposition: A | Discharge status: Home / Self Care | Payer: Medicare Other | Source: Ambulatory Visit | Attending: Internal Medicine | Admitting: Internal Medicine

## 2020-01-20 DIAGNOSIS — M79641 Pain in right hand: Secondary | ICD-10-CM

## 2020-01-20 DIAGNOSIS — I10 Essential (primary) hypertension: Secondary | ICD-10-CM | POA: Diagnosis not present

## 2020-01-20 DIAGNOSIS — E785 Hyperlipidemia, unspecified: Secondary | ICD-10-CM | POA: Diagnosis not present

## 2020-01-23 ENCOUNTER — Ambulatory Visit: Payer: Medicare Other | Attending: Internal Medicine

## 2020-01-23 ENCOUNTER — Other Ambulatory Visit: Payer: Self-pay

## 2020-01-23 DIAGNOSIS — Z23 Encounter for immunization: Secondary | ICD-10-CM | POA: Insufficient documentation

## 2020-01-23 NOTE — Progress Notes (Signed)
   Covid-19 Vaccination Clinic  Name:  Walter Goodwin    MRN: [id number] DOB: 1917-07-05  01/23/2020  Walter Goodwin was observed post Covid-19 immunization for 15 minutes without incidence. He was provided with Vaccine Information Sheet and instruction to access the V-Safe system.   Walter Goodwin was instructed to call 911 with any severe reactions post vaccine: Marland Kitchen Difficulty breathing  . Swelling of your face and throat  . A fast heartbeat  . A bad rash all over your body  . Dizziness and weakness    Immunizations Administered    Name Date Dose VIS Date Route   Moderna COVID-19 Vaccine 01/23/2020 12:13 PM 0.5 mL 11/12/2019 Intramuscular   Manufacturer: Moderna   Lot: [id number]   [id number]

## 2020-01-27 ENCOUNTER — Telehealth: Payer: Self-pay | Admitting: Orthopedic Surgery

## 2020-01-27 NOTE — Telephone Encounter (Signed)
Wait until 2nd vax

## 2020-01-27 NOTE — Telephone Encounter (Signed)
See below, wait until 2nd vaccination to come in to office. Please call patient to advise? Thanks

## 2020-01-27 NOTE — Telephone Encounter (Signed)
Called patient; said his 2nd vaccine is in March; said will have his wife call us back to schedule with Dr Aline Brochure.

## 2020-01-27 NOTE — Telephone Encounter (Signed)
What are your thoughts please, on the below?  Virtual to discuss vs office visit?

## 2020-01-27 NOTE — Telephone Encounter (Signed)
Spoke with patient and wife Walter Goodwin, whom he asked for [place] to speak with, regarding right hand pain and "not able to use hand" for several days. Said had been in touch with primary care, Dr Legrand Rams, who ordered Xray; had Xray done at [facility] radiology. States has not heard back from Dr Legrand Rams, and would like to be seen by Dr Aline Brochure in case 'need a shot."  States has had covid vaccine, #1 of 2. Please advise if in person visit Vs virtual, due to covid restrictions.

## 2020-02-18 DIAGNOSIS — E039 Hypothyroidism, unspecified: Secondary | ICD-10-CM | POA: Diagnosis not present

## 2020-02-18 DIAGNOSIS — I10 Essential (primary) hypertension: Secondary | ICD-10-CM | POA: Diagnosis not present

## 2020-02-24 ENCOUNTER — Ambulatory Visit: Payer: Medicare Other | Attending: Internal Medicine

## 2020-02-24 DIAGNOSIS — Z23 Encounter for immunization: Secondary | ICD-10-CM

## 2020-02-24 NOTE — Progress Notes (Signed)
   Covid-19 Vaccination Clinic  Name:  Walter Goodwin    MRN: [id number] DOB: 17-Mar-1917  02/24/2020  Mr. Walter Goodwin was observed post Covid-19 immunization for 15 minutes without incident. He was provided with Vaccine Information Sheet and instruction to access the V-Safe system.   Mr. Walter Goodwin was instructed to call 911 with any severe reactions post vaccine: Marland Kitchen Difficulty breathing  . Swelling of face and throat  . A fast heartbeat  . A bad rash all over body  . Dizziness and weakness   Immunizations Administered    Name Date Dose VIS Date Route   Moderna COVID-19 Vaccine 02/24/2020 11:39 AM 0.5 mL 11/12/2019 Intramuscular   Manufacturer: Moderna   Lot: [id number]   [place]:[id number]

## 2020-03-20 DIAGNOSIS — E785 Hyperlipidemia, unspecified: Secondary | ICD-10-CM | POA: Diagnosis not present

## 2020-03-20 DIAGNOSIS — E039 Hypothyroidism, unspecified: Secondary | ICD-10-CM | POA: Diagnosis not present

## 2020-04-21 DIAGNOSIS — R2681 Unsteadiness on feet: Secondary | ICD-10-CM | POA: Diagnosis not present

## 2020-04-21 DIAGNOSIS — Z1389 Encounter for screening for other disorder: Secondary | ICD-10-CM | POA: Diagnosis not present

## 2020-04-21 DIAGNOSIS — E039 Hypothyroidism, unspecified: Secondary | ICD-10-CM | POA: Diagnosis not present

## 2020-04-21 DIAGNOSIS — Z0001 Encounter for general adult medical examination with abnormal findings: Secondary | ICD-10-CM | POA: Diagnosis not present

## 2020-04-21 DIAGNOSIS — I10 Essential (primary) hypertension: Secondary | ICD-10-CM | POA: Diagnosis not present

## 2020-05-22 DIAGNOSIS — E039 Hypothyroidism, unspecified: Secondary | ICD-10-CM | POA: Diagnosis not present

## 2020-05-22 DIAGNOSIS — I10 Essential (primary) hypertension: Secondary | ICD-10-CM | POA: Diagnosis not present

## 2020-06-21 DIAGNOSIS — E785 Hyperlipidemia, unspecified: Secondary | ICD-10-CM | POA: Diagnosis not present

## 2020-06-21 DIAGNOSIS — I10 Essential (primary) hypertension: Secondary | ICD-10-CM | POA: Diagnosis not present

## 2020-07-05 ENCOUNTER — Encounter (HOSPITAL_COMMUNITY): Payer: Self-pay | Admitting: Emergency Medicine

## 2020-07-05 ENCOUNTER — Other Ambulatory Visit: Payer: Self-pay

## 2020-07-05 ENCOUNTER — Emergency Department (HOSPITAL_COMMUNITY)
Admission: EM | Admit: 2020-07-05 | Discharge: 2020-07-05 | Disposition: A | Discharge status: Home / Self Care | Payer: Medicare Other | Attending: Emergency Medicine | Admitting: Emergency Medicine

## 2020-07-05 ENCOUNTER — Emergency Department (HOSPITAL_COMMUNITY): Payer: Medicare Other

## 2020-07-05 DIAGNOSIS — Z79899 Other long term (current) drug therapy: Secondary | ICD-10-CM | POA: Insufficient documentation

## 2020-07-05 DIAGNOSIS — I517 Cardiomegaly: Secondary | ICD-10-CM | POA: Diagnosis not present

## 2020-07-05 DIAGNOSIS — R05 Cough: Secondary | ICD-10-CM | POA: Diagnosis not present

## 2020-07-05 DIAGNOSIS — J90 Pleural effusion, not elsewhere classified: Secondary | ICD-10-CM | POA: Diagnosis not present

## 2020-07-05 DIAGNOSIS — I10 Essential (primary) hypertension: Secondary | ICD-10-CM | POA: Insufficient documentation

## 2020-07-05 DIAGNOSIS — Z87891 Personal history of nicotine dependence: Secondary | ICD-10-CM | POA: Diagnosis not present

## 2020-07-05 DIAGNOSIS — J392 Other diseases of pharynx: Secondary | ICD-10-CM | POA: Diagnosis present

## 2020-07-05 DIAGNOSIS — R059 Cough, unspecified: Secondary | ICD-10-CM

## 2020-07-05 NOTE — ED Provider Notes (Signed)
[facility] Provider Note   CSN: [id number] Arrival date & time: 07/05/20  1018     History Chief Complaint  Patient presents with  . Swallowed Foreign Body    Walter Goodwin is a 86 y.o. male.  Patient states he was eating fish and he felt like something was irritating his throat.  He also has been coughing a lot.  The history is provided by the patient and a relative. No language interpreter was used.  Swallowed Foreign Body This is a new problem. The current episode started 6 to 12 hours ago. The problem occurs rarely. The problem has been resolved. Pertinent negatives include no chest pain, no abdominal pain and no headaches. Nothing aggravates the symptoms. Nothing relieves the symptoms. He has tried nothing for the symptoms.       Past Medical History:  Diagnosis Date  . Arthritis   . Cancer [facility]) 1995   prostate  . Dementia ([place])   . Gall stones   . High cholesterol   . Hypertension   . Renal disorder     Patient Active Problem List   Diagnosis Date Noted  . Dementia ([place]) 06/29/2017  . Anemia 06/29/2017  . Hyperlipidemia 06/29/2017  . Essential hypertension 06/25/2017  . Intertrochanteric fracture of right femur ([place]) 06/25/2017  . Knee pain, right 06/25/2017  . Syncope 06/25/2017    Past Surgical History:  Procedure Laterality Date  . CHOLECYSTECTOMY    . FRACTURE SURGERY    . INTRAMEDULLARY (IM) NAIL INTERTROCHANTERIC Right 06/26/2017   Procedure: INTRAMEDULLARY (IM) NAIL INTERTROCHANTRIC;  Surgeon: Nicholes Stairs, MD;  Location: [facility];  Service: Orthopedics;  Laterality: Right;  . [facility]   TURP       History reviewed. No pertinent family history.  Social History   Tobacco Use  . Smoking status: Former Smoker    Packs/day: 1.00    Years: 85.00    Pack years: 85.00    Types: Cigarettes    Quit date: 12/12/2006    Years since quitting: 13.5  . Smokeless tobacco: Former Systems developer    Types: Snuff, Chew   Vaping Use  . Vaping Use: Never used  Substance Use Topics  . Alcohol use: No  . Drug use: No    Home Medications Prior to Admission medications   Medication Sig Start Date End Date Taking? Authorizing Provider  acetaminophen (TYLENOL) 325 MG tablet Take 650 mg by mouth every 6 (six) hours as needed for mild pain.   Yes [provider]  amLODipine (NORVASC) 10 MG tablet Take 10 mg by mouth daily.   Yes [provider]  atorvastatin (LIPITOR) 10 MG tablet Take 10 mg by mouth at bedtime.    Yes [provider]  docusate sodium (COLACE) 100 MG capsule Take 100 mg by mouth 2 (two) times daily.   Yes [provider]  donepezil (ARICEPT) 10 MG tablet Take 10 mg by mouth at bedtime.   Yes [provider]  ferrous sulfate 325 (65 FE) MG tablet Take 325 mg by mouth daily before breakfast.   Yes [provider]  furosemide (LASIX) 40 MG tablet Take 40 mg by mouth daily.   Yes [provider]  levothyroxine (SYNTHROID) 175 MCG tablet Take 150 mcg by mouth daily before breakfast.    Yes [provider]  mirtazapine (REMERON) 15 MG tablet Take 15 mg by mouth at bedtime. 04/10/20  Yes [provider]  potassium chloride SA (  KLOR-CON) 20 MEQ tablet Take 20 mEq by mouth once.   Yes [provider]  sertraline (ZOLOFT) 100 MG tablet Take 100 mg by mouth daily.   Yes [provider]  traZODone (DESYREL) 100 MG tablet Take 50 mg by mouth in the morning and at bedtime. 02/06/20  Yes [provider]  vitamin B-12 (CYANOCOBALAMIN) 1000 MCG tablet Take 1,000 mcg by mouth daily.   Yes [provider]    Allergies    Patient has no known allergies.  Review of Systems   Review of Systems  Constitutional: Negative for appetite change and fatigue.  HENT: Negative for congestion, ear discharge and sinus pressure.   Eyes: Negative for discharge.  Respiratory: Positive for cough.   Cardiovascular:  Negative for chest pain.  Gastrointestinal: Negative for abdominal pain and diarrhea.  Genitourinary: Negative for frequency and hematuria.  Musculoskeletal: Negative for back pain.  Skin: Negative for rash.  Neurological: Negative for seizures and headaches.  Psychiatric/Behavioral: Negative for hallucinations.    Physical Exam Updated Vital Signs BP 108/84   Pulse 48   Temp 98.5 F (36.9 C) (Oral)   Resp 18   Ht 6' (1.829 m)   Wt (!) 97.5 kg   SpO2 97%   BMI 29.16 kg/m   Physical Exam Vitals and nursing note reviewed.  Constitutional:      Appearance: He is well-developed.  HENT:     Head: Normocephalic.     Nose: Nose normal.     Mouth/Throat:     Mouth: Mucous membranes are moist.  Eyes:     General: No scleral icterus.    Conjunctiva/sclera: Conjunctivae normal.  Neck:     Thyroid: No thyromegaly.  Cardiovascular:     Rate and Rhythm: Normal rate and regular rhythm.     Heart sounds: No murmur heard.  No friction rub. No gallop.   Pulmonary:     Breath sounds: No stridor. No wheezing or rales.  Chest:     Chest wall: No tenderness.  Abdominal:     General: There is no distension.     Tenderness: There is no abdominal tenderness. There is no rebound.  Musculoskeletal:        General: Normal range of motion.     Cervical back: Neck supple.  Lymphadenopathy:     Cervical: No cervical adenopathy.  Skin:    Findings: No erythema or rash.  Neurological:     Mental Status: He is alert and oriented to person, place, and time.     Motor: No abnormal muscle tone.     Coordination: Coordination normal.  Psychiatric:        Behavior: Behavior normal.     ED Results / Procedures / Treatments   Labs (all labs ordered are listed, but only abnormal results are displayed) Labs Reviewed - No data to display  EKG None  Radiology DG Chest [facility] 1 View  Result Date: 07/05/2020 CLINICAL DATA:  86 year old male with history of cough.  No priors. EXAM: PORTABLE  CHEST 1 VIEW COMPARISON:  Multiple priors, most recently chest x-ray 06/12/2019. FINDINGS: Lung volumes are normal. No consolidative airspace disease. Mild diffuse interstitial prominence, similar to numerous prior examinations, presumably chronic. No pneumothorax. No definite suspicious appearing pulmonary nodules or masses are noted. No pleural effusions. Mild cardiomegaly. 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 IMPRESSION: 1. Chronic findings without radiographic  evidence of acute cardiopulmonary disease, as above. 2. Mild cardiomegaly. Electronically Signed   By: Vinnie Langton M.D.   On: 07/05/2020 13:09    Procedures Procedures (including critical care time)  Medications Ordered in ED Medications - No data to display  ED Course  I have reviewed the triage vital signs and the nursing notes.  Pertinent labs & imaging results that were available during my care of the patient were reviewed by me and considered in my medical decision making (see chart for details). Patient no longer coughing and no longer having feeling that something is bothering his throat.  He drank some water without any problem chest x-ray was negative   MDM Rules/Calculators/A&P                         Patient with foreign body sensation that has improved along with his cough.  He will follow-up with his PCP as needed Final Clinical Impression(s) / ED Diagnoses Final diagnoses:  Cough    Rx / DC Orders ED Discharge Orders    None       Milton Ferguson, MD 07/05/20 1453

## 2020-07-05 NOTE — Discharge Instructions (Signed)
Follow-up with your family doctor if any problems 

## 2020-07-05 NOTE — ED Triage Notes (Signed)
Patient states "I feel like something is stuck in my throat." Per patient feeling started on Friday while eating fish. Patient states he believes he may have a small fish bone in throat. Patient does report eating and drinking since. Denies cough up any blood.

## 2020-07-22 DIAGNOSIS — I10 Essential (primary) hypertension: Secondary | ICD-10-CM | POA: Diagnosis not present

## 2020-07-22 DIAGNOSIS — E039 Hypothyroidism, unspecified: Secondary | ICD-10-CM | POA: Diagnosis not present

## 2020-08-24 ENCOUNTER — Emergency Department (HOSPITAL_COMMUNITY): Payer: Medicare Other

## 2020-08-24 ENCOUNTER — Encounter (HOSPITAL_COMMUNITY): Payer: Self-pay

## 2020-08-24 ENCOUNTER — Inpatient Hospital Stay (HOSPITAL_COMMUNITY)
Admission: EM | Admit: 2020-08-24 | Discharge: 2020-08-27 | DRG: 690 | Disposition: A | Discharge status: Skilled Nursing Facility | Payer: Medicare Other | Attending: Family Medicine | Admitting: Family Medicine

## 2020-08-24 DIAGNOSIS — N1831 Chronic kidney disease, stage 3a: Secondary | ICD-10-CM | POA: Diagnosis not present

## 2020-08-24 DIAGNOSIS — R4020 Unspecified coma: Secondary | ICD-10-CM | POA: Diagnosis not present

## 2020-08-24 DIAGNOSIS — M6281 Muscle weakness (generalized): Secondary | ICD-10-CM | POA: Diagnosis not present

## 2020-08-24 DIAGNOSIS — R6889 Other general symptoms and signs: Secondary | ICD-10-CM | POA: Diagnosis not present

## 2020-08-24 DIAGNOSIS — R55 Syncope and collapse: Secondary | ICD-10-CM | POA: Diagnosis not present

## 2020-08-24 DIAGNOSIS — Z87891 Personal history of nicotine dependence: Secondary | ICD-10-CM | POA: Diagnosis not present

## 2020-08-24 DIAGNOSIS — Z8546 Personal history of malignant neoplasm of prostate: Secondary | ICD-10-CM

## 2020-08-24 DIAGNOSIS — Z66 Do not resuscitate: Secondary | ICD-10-CM | POA: Diagnosis not present

## 2020-08-24 DIAGNOSIS — E860 Dehydration: Secondary | ICD-10-CM | POA: Diagnosis not present

## 2020-08-24 DIAGNOSIS — D329 Benign neoplasm of meninges, unspecified: Secondary | ICD-10-CM | POA: Diagnosis not present

## 2020-08-24 DIAGNOSIS — Z743 Need for continuous supervision: Secondary | ICD-10-CM | POA: Diagnosis not present

## 2020-08-24 DIAGNOSIS — H9190 Unspecified hearing loss, unspecified ear: Secondary | ICD-10-CM | POA: Diagnosis present

## 2020-08-24 DIAGNOSIS — N1832 Chronic kidney disease, stage 3b: Secondary | ICD-10-CM | POA: Diagnosis present

## 2020-08-24 DIAGNOSIS — N390 Urinary tract infection, site not specified: Secondary | ICD-10-CM | POA: Diagnosis not present

## 2020-08-24 DIAGNOSIS — Z20822 Contact with and (suspected) exposure to covid-19: Secondary | ICD-10-CM | POA: Diagnosis present

## 2020-08-24 DIAGNOSIS — R319 Hematuria, unspecified: Secondary | ICD-10-CM

## 2020-08-24 DIAGNOSIS — D631 Anemia in chronic kidney disease: Secondary | ICD-10-CM | POA: Diagnosis not present

## 2020-08-24 DIAGNOSIS — Z79899 Other long term (current) drug therapy: Secondary | ICD-10-CM

## 2020-08-24 DIAGNOSIS — N179 Acute kidney failure, unspecified: Secondary | ICD-10-CM

## 2020-08-24 DIAGNOSIS — R262 Difficulty in walking, not elsewhere classified: Secondary | ICD-10-CM | POA: Diagnosis not present

## 2020-08-24 DIAGNOSIS — E785 Hyperlipidemia, unspecified: Secondary | ICD-10-CM | POA: Diagnosis not present

## 2020-08-24 DIAGNOSIS — F0390 Unspecified dementia without behavioral disturbance: Secondary | ICD-10-CM | POA: Diagnosis present

## 2020-08-24 DIAGNOSIS — Z23 Encounter for immunization: Secondary | ICD-10-CM | POA: Diagnosis not present

## 2020-08-24 DIAGNOSIS — I129 Hypertensive chronic kidney disease with stage 1 through stage 4 chronic kidney disease, or unspecified chronic kidney disease: Secondary | ICD-10-CM | POA: Diagnosis present

## 2020-08-24 DIAGNOSIS — I517 Cardiomegaly: Secondary | ICD-10-CM | POA: Diagnosis not present

## 2020-08-24 DIAGNOSIS — R05 Cough: Secondary | ICD-10-CM | POA: Diagnosis not present

## 2020-08-24 DIAGNOSIS — Z79891 Long term (current) use of opiate analgesic: Secondary | ICD-10-CM | POA: Diagnosis not present

## 2020-08-24 DIAGNOSIS — N189 Chronic kidney disease, unspecified: Secondary | ICD-10-CM | POA: Diagnosis not present

## 2020-08-24 DIAGNOSIS — Z9049 Acquired absence of other specified parts of digestive tract: Secondary | ICD-10-CM

## 2020-08-24 DIAGNOSIS — Z741 Need for assistance with personal care: Secondary | ICD-10-CM | POA: Diagnosis not present

## 2020-08-24 DIAGNOSIS — I10 Essential (primary) hypertension: Secondary | ICD-10-CM

## 2020-08-24 DIAGNOSIS — G9389 Other specified disorders of brain: Secondary | ICD-10-CM | POA: Diagnosis not present

## 2020-08-24 DIAGNOSIS — E039 Hypothyroidism, unspecified: Secondary | ICD-10-CM | POA: Diagnosis not present

## 2020-08-24 DIAGNOSIS — Z79890 Hormone replacement therapy (postmenopausal): Secondary | ICD-10-CM

## 2020-08-24 DIAGNOSIS — N3000 Acute cystitis without hematuria: Secondary | ICD-10-CM | POA: Diagnosis not present

## 2020-08-24 DIAGNOSIS — B9620 Unspecified Escherichia coli [E. coli] as the cause of diseases classified elsewhere: Secondary | ICD-10-CM | POA: Diagnosis present

## 2020-08-24 DIAGNOSIS — R531 Weakness: Secondary | ICD-10-CM | POA: Diagnosis not present

## 2020-08-24 DIAGNOSIS — H9193 Unspecified hearing loss, bilateral: Secondary | ICD-10-CM | POA: Diagnosis not present

## 2020-08-24 LAB — COMPREHENSIVE METABOLIC PANEL
ALT: 12 U/L (ref 0–44)
AST: 23 U/L (ref 15–41)
Albumin: 3.6 g/dL (ref 3.5–5.0)
Alkaline Phosphatase: 61 U/L (ref 38–126)
Anion gap: 10 (ref 5–15)
BUN: 22 mg/dL (ref 8–23)
CO2: 23 mmol/L (ref 22–32)
Calcium: 9.6 mg/dL (ref 8.9–10.3)
Chloride: 103 mmol/L (ref 98–111)
Creatinine, Ser: 1.62 mg/dL — ABNORMAL HIGH (ref 0.61–1.24)
GFR calc Af Amer: 39 mL/min — ABNORMAL LOW (ref >60)
GFR calc non Af Amer: 34 mL/min — ABNORMAL LOW (ref >60)
Glucose, Bld: 103 mg/dL — ABNORMAL HIGH (ref 70–99)
Potassium: 4.1 mmol/L (ref 3.5–5.1)
Sodium: 136 mmol/L (ref 135–145)
Total Bilirubin: 1 mg/dL (ref 0.3–1.2)
Total Protein: 7.3 g/dL (ref 6.5–8.1)

## 2020-08-24 LAB — URINALYSIS, ROUTINE W REFLEX MICROSCOPIC
Bilirubin Urine: NEGATIVE
Glucose, UA: NEGATIVE mg/dL
Ketones, ur: NEGATIVE mg/dL
Nitrite: NEGATIVE
Protein, ur: 100 mg/dL — AB
RBC / HPF: >50 RBC/hpf — ABNORMAL HIGH (ref 0–5)
Specific Gravity, Urine: 1.009 (ref 1.005–1.030)
WBC, UA: >50 WBC/hpf — ABNORMAL HIGH (ref 0–5)
pH: 6 (ref 5.0–8.0)

## 2020-08-24 LAB — CBC WITH DIFFERENTIAL/PLATELET
Abs Immature Granulocytes: 0.09 10*3/uL — ABNORMAL HIGH (ref 0.00–0.07)
Basophils Absolute: 0.1 10*3/uL (ref 0.0–0.1)
Basophils Relative: 1 %
Eosinophils Absolute: 0 10*3/uL (ref 0.0–0.5)
Eosinophils Relative: 0 %
HCT: 37.3 % — ABNORMAL LOW (ref 39.0–52.0)
Hemoglobin: 11.7 g/dL — ABNORMAL LOW (ref 13.0–17.0)
Immature Granulocytes: 1 %
Lymphocytes Relative: 6 %
Lymphs Abs: 0.9 10*3/uL (ref 0.7–4.0)
MCH: 29.5 pg (ref 26.0–34.0)
MCHC: 31.4 g/dL (ref 30.0–36.0)
MCV: 94 fL (ref 80.0–100.0)
Monocytes Absolute: 1.4 10*3/uL — ABNORMAL HIGH (ref 0.1–1.0)
Monocytes Relative: 10 %
Neutro Abs: 12.2 10*3/uL — ABNORMAL HIGH (ref 1.7–7.7)
Neutrophils Relative %: 82 %
Platelets: 223 10*3/uL (ref 150–400)
RBC: 3.97 MIL/uL — ABNORMAL LOW (ref 4.22–5.81)
RDW: 16.1 % — ABNORMAL HIGH (ref 11.5–15.5)
WBC: 14.7 10*3/uL — ABNORMAL HIGH (ref 4.0–10.5)
nRBC: 0 % (ref 0.0–0.2)

## 2020-08-24 LAB — CBG MONITORING, ED: Glucose-Capillary: 87 mg/dL (ref 70–99)

## 2020-08-24 LAB — SARS CORONAVIRUS 2 BY RT PCR (HOSPITAL ORDER, PERFORMED IN ~~LOC~~ HOSPITAL LAB): SARS Coronavirus 2: NEGATIVE

## 2020-08-24 MED ORDER — SODIUM CHLORIDE 0.9 % IV SOLN: INTRAVENOUS | Status: AC

## 2020-08-24 MED ORDER — SERTRALINE HCL 50 MG PO TABS
100.0000 mg | ORAL_TABLET | Freq: Every day | ORAL | Status: DC
  Administered 2020-08-24 – 2020-08-27 (×4): 100 mg via ORAL
  Filled 2020-08-24 (×4): qty 2

## 2020-08-24 MED ORDER — SODIUM CHLORIDE 0.9 % IV BOLUS
500.0000 mL | Freq: Once | INTRAVENOUS | Status: AC
  Administered 2020-08-24: 13:00:00 500 mL via INTRAVENOUS

## 2020-08-24 MED ORDER — SODIUM CHLORIDE 0.9 % IV SOLN
1.0000 g | INTRAVENOUS | Status: DC
  Administered 2020-08-25 – 2020-08-27 (×3): 1 g via INTRAVENOUS
  Filled 2020-08-24 (×3): qty 10

## 2020-08-24 MED ORDER — ONDANSETRON HCL 4 MG PO TABS: 4.0000 mg | ORAL_TABLET | Freq: Four times a day (QID) | ORAL | Status: DC | PRN

## 2020-08-24 MED ORDER — ATORVASTATIN CALCIUM 10 MG PO TABS
10.0000 mg | ORAL_TABLET | Freq: Every day | ORAL | Status: DC
  Administered 2020-08-24 – 2020-08-26 (×3): 10 mg via ORAL
  Filled 2020-08-24 (×3): qty 1

## 2020-08-24 MED ORDER — SODIUM CHLORIDE 0.9 % IV SOLN
1.0000 g | Freq: Once | INTRAVENOUS | Status: AC
  Administered 2020-08-24: 17:00:00 1 g via INTRAVENOUS
  Filled 2020-08-24: qty 10

## 2020-08-24 MED ORDER — ACETAMINOPHEN 325 MG PO TABS: 650.0000 mg | ORAL_TABLET | Freq: Four times a day (QID) | ORAL | Status: DC | PRN

## 2020-08-24 MED ORDER — LEVOTHYROXINE SODIUM 75 MCG PO TABS
175.0000 ug | ORAL_TABLET | Freq: Every day | ORAL | Status: DC
  Administered 2020-08-25 – 2020-08-27 (×3): 175 ug via ORAL
  Filled 2020-08-24: qty 1
  Filled 2020-08-24: qty 4
  Filled 2020-08-24: qty 1

## 2020-08-24 MED ORDER — MIRTAZAPINE 15 MG PO TABS
15.0000 mg | ORAL_TABLET | Freq: Every day | ORAL | Status: DC
  Administered 2020-08-24 – 2020-08-26 (×3): 15 mg via ORAL
  Filled 2020-08-24 (×3): qty 1

## 2020-08-24 MED ORDER — ENOXAPARIN SODIUM 30 MG/0.3ML ~~LOC~~ SOLN
30.0000 mg | SUBCUTANEOUS | Status: DC
  Administered 2020-08-24 – 2020-08-26 (×3): 30 mg via SUBCUTANEOUS
  Filled 2020-08-24 (×3): qty 0.3

## 2020-08-24 MED ORDER — DONEPEZIL HCL 5 MG PO TABS
10.0000 mg | ORAL_TABLET | Freq: Every day | ORAL | Status: DC
  Administered 2020-08-25 – 2020-08-26 (×3): 10 mg via ORAL
  Filled 2020-08-24 (×5): qty 2

## 2020-08-24 MED ORDER — TRAZODONE HCL 50 MG PO TABS: 25.0000 mg | ORAL_TABLET | Freq: Two times a day (BID) | ORAL | Status: DC | PRN

## 2020-08-24 MED ORDER — ONDANSETRON HCL 4 MG/2ML IJ SOLN: 4.0000 mg | Freq: Four times a day (QID) | INTRAMUSCULAR | Status: DC | PRN

## 2020-08-24 MED ORDER — AMLODIPINE BESYLATE 5 MG PO TABS
10.0000 mg | ORAL_TABLET | Freq: Every day | ORAL | Status: DC
  Administered 2020-08-24 – 2020-08-27 (×4): 10 mg via ORAL
  Filled 2020-08-24 (×4): qty 2

## 2020-08-24 NOTE — H&P (Signed)
TRH H&P    Patient Demographics:    Walter Goodwin, is a 86 y.o. male  MRN: [id number]  DOB - [birth year]  Admit Date - 08/24/2020  Referring MD/NP/PA: Dr. Roderic Palau  Outpatient Primary MD for the patient is Rosita Fire, MD  Patient coming from: Home  Chief complaint-generalized weakness   HPI:    Walter Goodwin  is a 86 y.o. male, with history of dementia, prostate cancer, hypertension was brought to the hospital by family as patient was unable to get out of bed.  As per patient's daughter he has been weak since yesterday this morning he ate his breakfast but was having difficulty getting out of bed.  There were no symptoms of nausea vomiting or diarrhea.  No fever or chills.  No cough.  No chest pain. Patient is a poor historian with history of dementia and hearing impairment. He denies any pain at this time. In the ED lab work showed abnormal UA, patient was started on Rocephin for UTI. WBC 14.7.     Review of systems:    In addition to the HPI above,   All other systems reviewed and are negative.    Past History of the following :    Past Medical History:  Diagnosis Date  . Arthritis   . Cancer [facility]) 1995   prostate  . Dementia ([place])   . Gall stones   . High cholesterol   . Hypertension   . Renal disorder       Past Surgical History:  Procedure Laterality Date  . CHOLECYSTECTOMY    . FRACTURE SURGERY    . INTRAMEDULLARY (IM) NAIL INTERTROCHANTERIC Right 06/26/2017   Procedure: INTRAMEDULLARY (IM) NAIL INTERTROCHANTRIC;  Surgeon: Nicholes Stairs, MD;  Location: [facility];  Service: Orthopedics;  Laterality: Right;  . PROSTATE SURGERY  1996   TURP      Social History:      Social History   Tobacco Use  . Smoking status: Former Smoker    Packs/day: 1.00    Years: 85.00    Pack years: 85.00    Types: Cigarettes    Quit date: 12/12/2006    Years since quitting: 13.7  .  Smokeless tobacco: Former Systems developer    Types: Snuff, Chew  Substance Use Topics  . Alcohol use: No       Family History :   Unable to obtain due to patient's dementia   Home Medications:   Prior to Admission medications   Medication Sig Start Date End Date Taking? Authorizing Provider  acetaminophen (TYLENOL) 325 MG tablet Take 650 mg by mouth every 6 (six) hours as needed for mild pain.   Yes [provider]  amLODipine (NORVASC) 10 MG tablet Take 10 mg by mouth daily.   Yes [provider]  atorvastatin (LIPITOR) 10 MG tablet Take 10 mg by mouth at bedtime.    Yes [provider]  docusate sodium (COLACE) 100 MG capsule Take 100 mg by mouth 2 (two) times daily.   Yes [provider]  donepezil (ARICEPT)  10 MG tablet Take 10 mg by mouth at bedtime.   Yes [provider]  ferrous sulfate 325 (65 FE) MG tablet Take 325 mg by mouth daily before breakfast.   Yes [provider]  furosemide (LASIX) 40 MG tablet Take 40 mg by mouth daily.   Yes [provider]  levothyroxine (SYNTHROID) 175 MCG tablet Take 175 mcg by mouth daily before breakfast.    Yes [provider]  mirtazapine (REMERON) 15 MG tablet Take 15 mg by mouth at bedtime. 04/10/20  Yes [provider]  potassium chloride SA (KLOR-CON) 20 MEQ tablet Take 20 mEq by mouth once.   Yes [provider]  promethazine-dextromethorphan (PROMETHAZINE-DM) 6.25-15 MG/5ML syrup Take 5 mLs by mouth every 4 (four) hours as needed for cough. 07/06/20  Yes [provider]  sertraline (ZOLOFT) 100 MG tablet Take 100 mg by mouth daily.   Yes [provider]  traZODone (DESYREL) 50 MG tablet Take 25 mg by mouth 2 (two) times daily as needed (agitation).  02/06/20  Yes [provider]  vitamin B-12 (CYANOCOBALAMIN) 1000 MCG tablet Take 1,000 mcg by mouth daily.   Yes [provider]     Allergies:    No Known Allergies    Physical Exam:   Vitals  Blood pressure (!) 128/114, pulse 72, temperature 99 F (37.2 C), temperature source Oral, resp. rate (!) 24, height 6' (1.829 m), weight 95.3 kg, SpO2 97 %.  1.  General: Appears in no acute distress  2. Psychiatric: Alert, oriented to self and place only  3. Neurologic: Cranial nerves II through XII grossly intact, no focal deficit noted, moving all extremities  4. HEENMT:  Atraumatic normocephalic, extraocular muscles are intact  5. Respiratory : Clear to auscultation bilaterally  6. Cardiovascular : S1-S2, regular, no murmur auscultated  7. Gastrointestinal:  Abdomen is soft, nontender, no organomegaly      Data Review:    CBC Recent Labs  Lab 08/24/20 1133  WBC 14.7*  HGB 11.7*  HCT 37.3*  PLT 223  MCV 94.0  MCH 29.5  MCHC 31.4  RDW 16.1*  LYMPHSABS 0.9  MONOABS 1.4*  EOSABS 0.0  BASOSABS 0.1   ------------------------------------------------------------------------------------------------------------------  Results for orders placed or performed during the hospital encounter of 08/24/20 (from the past 48 hour(s))  CBC with Differential/Platelet     Status: Abnormal   Collection Time: 08/24/20 11:33 AM  Result Value Ref Range   WBC 14.7 (H) 4.0 - 10.5 K/uL   RBC 3.97 (L) 4.22 - 5.81 MIL/uL   Hemoglobin 11.7 (L) 13.0 - 17.0 g/dL   HCT 37.3 (L) 39 - 52 %   MCV 94.0 80.0 - 100.0 fL   MCH 29.5 26.0 - 34.0 pg   MCHC 31.4 30.0 - 36.0 g/dL   RDW 16.1 (H) 11.5 - 15.5 %   Platelets 223 150 - 400 K/uL   nRBC 0.0 0.0 - 0.2 %   Neutrophils Relative % 82 %   Neutro Abs 12.2 (H) 1.7 - 7.7 K/uL   Lymphocytes Relative 6 %   Lymphs Abs 0.9 0.7 - 4.0 K/uL   Monocytes Relative 10 %   Monocytes Absolute 1.4 (H) 0 - 1 K/uL   Eosinophils Relative 0 %   Eosinophils Absolute 0.0 0 - 0 K/uL   Basophils Relative 1 %   Basophils Absolute 0.1 0 - 0 K/uL   Immature Granulocytes 1 %   Abs Immature Granulocytes 0.09 (H) 0.00 - 0.07 K/uL  Comment: Performed at [facility], [address] [place], [place] [address]  Comprehensive metabolic panel     Status: Abnormal   Collection Time: 08/24/20 11:33 AM  Result Value Ref Range   Sodium 136 135 - 145 mmol/L   Potassium 4.1 3.5 - 5.1 mmol/L   Chloride 103 98 - 111 mmol/L   CO2 23 22 - 32 mmol/L   Glucose, Bld 103 (H) 70 - 99 mg/dL    Comment: Glucose reference range applies only to samples taken after fasting for at least 8 hours.   BUN 22 8 - 23 mg/dL   Creatinine, Ser 1.62 (H) 0.61 - 1.24 mg/dL   Calcium 9.6 8.9 - 10.3 mg/dL   Total Protein 7.3 6.5 - 8.1 g/dL   Albumin 3.6 3.5 - 5.0 g/dL   AST 23 15 - 41 U/L   ALT 12 0 - 44 U/L   Alkaline Phosphatase 61 38 - 126 U/L   Total Bilirubin 1.0 0.3 - 1.2 mg/dL   GFR calc non Af Amer 34 (L) >60 mL/min   GFR calc Af Amer 39 (L) >60 mL/min   Anion gap 10 5 - 15    Comment: Performed at [facility], [address] [place], [place] [address]  Urinalysis, Routine w reflex microscopic Urine, Catheterized     Status: Abnormal   Collection Time: 08/24/20  2:10 PM  Result Value Ref Range   Color, Urine YELLOW YELLOW   APPearance CLOUDY (A) CLEAR   Specific Gravity, Urine 1.009 1.005 - 1.030   pH 6.0 5.0 - 8.0   Glucose, UA NEGATIVE NEGATIVE mg/dL   Hgb urine dipstick LARGE (A) NEGATIVE   Bilirubin Urine NEGATIVE NEGATIVE   Ketones, ur NEGATIVE NEGATIVE mg/dL   Protein, ur 100 (A) NEGATIVE mg/dL   Nitrite NEGATIVE NEGATIVE   Leukocytes,Ua LARGE (A) NEGATIVE   RBC / HPF >50 (H) 0 - 5 RBC/hpf   WBC, UA >50 (H) 0 - 5 WBC/hpf   Bacteria, UA RARE (A) NONE SEEN   Squamous Epithelial / LPF 6-10 0 - 5   WBC Clumps PRESENT     Comment: Performed at [facility], [address], [place], [place] [address]    Chemistries  Recent Labs  Lab 08/24/20 1133  NA 136  K 4.1  CL 103  CO2 23  GLUCOSE 103*  BUN 22  CREATININE 1.62*  CALCIUM 9.6  AST 23  ALT 12  ALKPHOS 61  BILITOT 1.0    ------------------------------------------------------------------------------------------------------------------  ------------------------------------------------------------------------------------------------------------------ GFR: Estimated Creatinine Clearance: 28.3 mL/min (A) (by C-G formula based on SCr of 1.62 mg/dL (H)). Liver Function Tests: Recent Labs  Lab 08/24/20 1133  AST 23  ALT 12  ALKPHOS 61  BILITOT 1.0  PROT 7.3  ALBUMIN 3.6   No results for input(s): LIPASE, AMYLASE in the last 168 hours.  --------------------------------------------------------------------------------------------------------------- Urine analysis:    Component Value Date/Time   COLORURINE YELLOW 08/24/2020 1410   APPEARANCEUR CLOUDY (A) 08/24/2020 1410   LABSPEC 1.009 08/24/2020 1410   PHURINE 6.0 08/24/2020 1410   GLUCOSEU NEGATIVE 08/24/2020 1410   HGBUR LARGE (A) 08/24/2020 1410   BILIRUBINUR NEGATIVE 08/24/2020 1410   KETONESUR NEGATIVE 08/24/2020 1410   PROTEINUR 100 (A) 08/24/2020 1410   NITRITE NEGATIVE 08/24/2020 1410   LEUKOCYTESUR LARGE (A) 08/24/2020 1410      Imaging Results:    DG Chest Port 1 View  Result Date: 08/24/2020 CLINICAL DATA:  Weakness, dementia, COVID test pending EXAM: PORTABLE CHEST 1 VIEW  COMPARISON:  07/05/2020 FINDINGS: Lungs are essentially clear.  No pleural effusion or pneumothorax. Cardiomegaly. Prominent right paratracheal stripe, vascular when correlating with prior CT. Degenerative changes of the bilateral shoulders. IMPRESSION: No evidence of acute cardiopulmonary disease. Electronically Signed   By: Julian Hy M.D.   On: 08/24/2020 10:58    My personal review of EKG: Rhythm NSR, no ST changes   Assessment & Plan:    Active Problems:   UTI (urinary tract infection)   1. UTI-patient found to have abnormal UA, started on IV Rocephin.  We will continue with Rocephin IV every 24 hours.  Follow urine culture  results. 2. Hypertension-blood pressures controlled, continue Norvasc 3. Acute kidney injury versus CKD stage IIIb-patient's last creatinine was 1.07 as of 2018.  Currently presenting with creatinine 1.62.  He is on diuretics with Lasix 40 mg daily at home.  Not sure whether this is acute kidney injury due to diuretic or progression of CKD.  Will hold Lasix at this time, start gentle IV hydration with normal saline 50 mill per hour.  Follow BMP in a.m. 4. Hypothyroidism-continue Synthroid 5. History of dementia without behavioral disturbance-continue Aricept, Remeron, trazodone.     DVT Prophylaxis-   Lovenox   AM Labs Ordered, also please review Full Orders  Family Communication: Admission, patients condition and plan of care including tests being ordered have been discussed with the patient daughter on phone who indicate understanding and agree with the plan and Code Status.  Code Status: DNR  Admission status: Observation/Inpatient :The appropriate admission status for this patient is INPATIENT. Inpatient status is judged to be reasonable and necessary in order to provide the required intensity of service to ensure the patient's safety. The patient's presenting symptoms, physical exam findings, and initial radiographic and laboratory data in the context of their chronic comorbidities is felt to place them at high risk for further clinical deterioration. Furthermore, it is not anticipated that the patient will be medically stable for discharge from the hospital within 2 midnights of admission. The following factors support the admission status of inpatient.     The patient's presenting symptoms include generalized weakness. The worrisome physical exam findings include none. The initial radiographic and laboratory data are worrisome because of UTI. The chronic co-morbidities include dementia.       * I certify that at the point of admission it is my clinical judgment that the patient  will require inpatient hospital care spanning beyond 2 midnights from the point of admission due to high intensity of service, high risk for further deterioration and high frequency of surveillance required.*  Time spent in minutes : 60 minutes   Brendolyn Stockley S Pang Robers M.D

## 2020-08-24 NOTE — ED Notes (Signed)
Attempted to ambulate pt. Pt required max assist x 2 to stand and unable to take step . Required max assist to continue to stand. Noted to have blood in urine

## 2020-08-24 NOTE — ED Triage Notes (Signed)
Pt brought in EMS. Pt lives with family and has dementia. Family reports that he has not been able to get OOB for 2 days. Normally up walking with walker. No pain

## 2020-08-24 NOTE — ED Provider Notes (Signed)
[facility] EMERGENCY DEPARTMENT Provider Note   CSN: [id number] Arrival date & time: 08/24/20  1014     History Chief Complaint  Patient presents with  . Extremity Weakness    Walter Goodwin is a 86 y.o. male.  Patient has a history of dementia.  He is unable to get out of the bed for the last couple days.  He has been more confused.  The history is provided by a relative. No language interpreter was used.  Weakness Severity:  Moderate Onset quality:  Sudden Timing:  Constant Progression:  Worsening Chronicity:  New Context: not alcohol use   Relieved by:  Nothing Worsened by:  Nothing Ineffective treatments:  None tried Associated symptoms: no abdominal pain, no chest pain, no cough, no diarrhea, no frequency, no headaches and no seizures        Past Medical History:  Diagnosis Date  . Arthritis   . Cancer [facility]) 1995   prostate  . Dementia ([place])   . Gall stones   . High cholesterol   . Hypertension   . Renal disorder     Patient Active Problem List   Diagnosis Date Noted  . Dementia ([place]) 06/29/2017  . Anemia 06/29/2017  . Hyperlipidemia 06/29/2017  . Essential hypertension 06/25/2017  . Intertrochanteric fracture of right femur ([place]) 06/25/2017  . Knee pain, right 06/25/2017  . Syncope 06/25/2017    Past Surgical History:  Procedure Laterality Date  . CHOLECYSTECTOMY    . FRACTURE SURGERY    . INTRAMEDULLARY (IM) NAIL INTERTROCHANTERIC Right 06/26/2017   Procedure: INTRAMEDULLARY (IM) NAIL INTERTROCHANTRIC;  Surgeon: Nicholes Stairs, MD;  Location: [place];  Service: Orthopedics;  Laterality: Right;  . PROSTATE SURGERY  1996   TURP       No family history on file.  Social History   Tobacco Use  . Smoking status: Former Smoker    Packs/day: 1.00    Years: 85.00    Pack years: 85.00    Types: Cigarettes    Quit date: 12/12/2006    Years since quitting: 13.7  . Smokeless tobacco: Former Systems developer    Types: Snuff, Chew  Vaping Use  .  Vaping Use: Never used  Substance Use Topics  . Alcohol use: No  . Drug use: No    Home Medications Prior to Admission medications   Medication Sig Start Date End Date Taking? Authorizing Provider  acetaminophen (TYLENOL) 325 MG tablet Take 650 mg by mouth every 6 (six) hours as needed for mild pain.   Yes [provider]  amLODipine (NORVASC) 10 MG tablet Take 10 mg by mouth daily.   Yes [provider]  atorvastatin (LIPITOR) 10 MG tablet Take 10 mg by mouth at bedtime.    Yes [provider]  docusate sodium (COLACE) 100 MG capsule Take 100 mg by mouth 2 (two) times daily.   Yes [provider]  donepezil (ARICEPT) 10 MG tablet Take 10 mg by mouth at bedtime.   Yes [provider]  ferrous sulfate 325 (65 FE) MG tablet Take 325 mg by mouth daily before breakfast.   Yes [provider]  furosemide (LASIX) 40 MG tablet Take 40 mg by mouth daily.   Yes [provider]  levothyroxine (SYNTHROID) 175 MCG tablet Take 175 mcg by mouth daily before breakfast.    Yes [provider]  mirtazapine (REMERON) 15 MG tablet Take 15 mg by mouth at bedtime. 04/10/20  Yes [provider]  potassium  chloride SA (KLOR-CON) 20 MEQ tablet Take 20 mEq by mouth once.   Yes [provider]  promethazine-dextromethorphan (PROMETHAZINE-DM) 6.25-15 MG/5ML syrup Take 5 mLs by mouth every 4 (four) hours as needed for cough. 07/06/20  Yes [provider]  sertraline (ZOLOFT) 100 MG tablet Take 100 mg by mouth daily.   Yes [provider]  traZODone (DESYREL) 50 MG tablet Take 25 mg by mouth 2 (two) times daily as needed (agitation).  02/06/20  Yes [provider]  vitamin B-12 (CYANOCOBALAMIN) 1000 MCG tablet Take 1,000 mcg by mouth daily.   Yes [provider]    Allergies    Patient has no known allergies.  Review of Systems   Review of Systems  Constitutional: Negative for appetite change  and fatigue.  HENT: Negative for congestion, ear discharge and sinus pressure.   Eyes: Negative for discharge.  Respiratory: Negative for cough.   Cardiovascular: Negative for chest pain.  Gastrointestinal: Negative for abdominal pain and diarrhea.  Genitourinary: Negative for frequency and hematuria.  Musculoskeletal: Negative for back pain.  Skin: Negative for rash.  Neurological: Positive for weakness. Negative for seizures and headaches.  Psychiatric/Behavioral: Negative for hallucinations.    Physical Exam Updated Vital Signs BP (!) 128/114   Pulse 72   Temp 99 F (37.2 C) (Oral)   Resp (!) 24   Ht 6' (1.829 m)   Wt 95.3 kg   SpO2 97%   BMI 28.48 kg/m   Physical Exam Vitals and nursing note reviewed.  Constitutional:      Appearance: He is well-developed.  HENT:     Head: Normocephalic.     Nose: Nose normal.  Eyes:     General: No scleral icterus.    Conjunctiva/sclera: Conjunctivae normal.  Neck:     Thyroid: No thyromegaly.  Cardiovascular:     Rate and Rhythm: Normal rate and regular rhythm.     Heart sounds: No murmur heard.  No friction rub. No gallop.   Pulmonary:     Breath sounds: No stridor. No wheezing or rales.  Chest:     Chest wall: No tenderness.  Abdominal:     General: There is no distension.     Tenderness: There is no abdominal tenderness. There is no rebound.  Musculoskeletal:        General: Normal range of motion.     Cervical back: Neck supple.     Comments: Patient can move all extremities but he is too weak to walk.  He cannot stand even without assist  Lymphadenopathy:     Cervical: No cervical adenopathy.  Skin:    Findings: No erythema or rash.  Neurological:     Mental Status: He is alert.     Motor: No abnormal muscle tone.     Coordination: Coordination normal.     Comments: Patient oriented to person only  Psychiatric:        Behavior: Behavior normal.     ED Results / Procedures / Treatments   Labs (all labs  ordered are listed, but only abnormal results are displayed) Labs Reviewed  CBC WITH DIFFERENTIAL/PLATELET - Abnormal; Notable for the following components:      Result Value   WBC 14.7 (*)    RBC 3.97 (*)    Hemoglobin 11.7 (*)    HCT 37.3 (*)    RDW 16.1 (*)    Neutro Abs 12.2 (*)    Monocytes Absolute 1.4 (*)    Abs Immature Granulocytes  0.09 (*)    All other components within normal limits  COMPREHENSIVE METABOLIC PANEL - Abnormal; Notable for the following components:   Glucose, Bld 103 (*)    Creatinine, Ser 1.62 (*)    GFR calc non Af Amer 34 (*)    GFR calc Af Amer 39 (*)    All other components within normal limits  SARS CORONAVIRUS 2 BY RT PCR (HOSPITAL ORDER, [place] [facility])  URINALYSIS, ROUTINE W REFLEX MICROSCOPIC    EKG None  Radiology DG Chest Port 1 View  Result Date: 08/24/2020 CLINICAL DATA:  Weakness, dementia, COVID test pending EXAM: PORTABLE CHEST 1 VIEW COMPARISON:  07/05/2020 FINDINGS: Lungs are essentially clear.  No pleural effusion or pneumothorax. Cardiomegaly. Prominent right paratracheal stripe, vascular when correlating with prior CT. Degenerative changes of the bilateral shoulders. IMPRESSION: No evidence of acute cardiopulmonary disease. Electronically Signed   By: Julian Hy M.D.   On: 08/24/2020 10:58    Procedures Procedures (including critical care time)  Medications Ordered in ED Medications  sodium chloride 0.9 % bolus 500 mL (0 mLs Intravenous Stopped 08/24/20 1345)    ED Course  I have reviewed the triage vital signs and the nursing notes.  Pertinent labs & imaging results that were available during my care of the patient were reviewed by me and considered in my medical decision making (see chart for details).    MDM Rules/Calculators/A&P                          Patient with significant fatigue.  CBC shows leukocytosis.  Urinalysis and CT head pending.  Patient will need to be admitted for  increased fatigue and inability to ambulate..    Urinalysis does show urinary tract infection      This patient presents to the ED for concern of weakness, this involves an extensive number of treatment options, and is a complaint that carries with it a high risk of complications and morbidity.  The differential diagnosis includes stroke UTI   Lab Tests:   I Ordered, reviewed, and interpreted labs, which included CBC and chemistries and urinalysis that showed elevated white count of 13.9 and a urinalysis that shows UTI  Medicines ordered:   I ordered medication normal saline for dehydration  Imaging Studies ordered:   I ordered imaging studies which included CT head and chest x-ray  I independently visualized and interpreted imaging which showed no acute findings  Additional history obtained:   Additional history obtained from family  Previous records obtained and reviewed.  Consultations Obtained:   I consulted hospitalist and discussed lab and imaging findings  Reevaluation:  After the interventions stated above, I reevaluated the patient and found no change  Critical Interventions:  .   Final Clinical Impression(s) / ED Diagnoses Final diagnoses:  None    Rx / DC Orders ED Discharge Orders    None       Milton Ferguson, MD 08/26/20 1753

## 2020-08-24 NOTE — ED Notes (Signed)
Patient transported to CT 

## 2020-08-25 ENCOUNTER — Other Ambulatory Visit: Payer: Self-pay

## 2020-08-25 DIAGNOSIS — N3000 Acute cystitis without hematuria: Secondary | ICD-10-CM

## 2020-08-25 LAB — CBC
HCT: 35.7 % — ABNORMAL LOW (ref 39.0–52.0)
Hemoglobin: 11.1 g/dL — ABNORMAL LOW (ref 13.0–17.0)
MCH: 29.7 pg (ref 26.0–34.0)
MCHC: 31.1 g/dL (ref 30.0–36.0)
MCV: 95.5 fL (ref 80.0–100.0)
Platelets: 221 10*3/uL (ref 150–400)
RBC: 3.74 MIL/uL — ABNORMAL LOW (ref 4.22–5.81)
RDW: 16 % — ABNORMAL HIGH (ref 11.5–15.5)
WBC: 13.9 10*3/uL — ABNORMAL HIGH (ref 4.0–10.5)
nRBC: 0 % (ref 0.0–0.2)

## 2020-08-25 LAB — BASIC METABOLIC PANEL
Anion gap: 8 (ref 5–15)
BUN: 21 mg/dL (ref 8–23)
CO2: 22 mmol/L (ref 22–32)
Calcium: 9 mg/dL (ref 8.9–10.3)
Chloride: 107 mmol/L (ref 98–111)
Creatinine, Ser: 1.31 mg/dL — ABNORMAL HIGH (ref 0.61–1.24)
GFR calc Af Amer: 51 mL/min — ABNORMAL LOW (ref >60)
GFR calc non Af Amer: 44 mL/min — ABNORMAL LOW (ref >60)
Glucose, Bld: 93 mg/dL (ref 70–99)
Potassium: 3.9 mmol/L (ref 3.5–5.1)
Sodium: 137 mmol/L (ref 135–145)

## 2020-08-25 LAB — TSH: TSH: 2.438 u[IU]/mL (ref 0.350–4.500)

## 2020-08-25 MED ORDER — HYDRALAZINE HCL 25 MG PO TABS: 25.0000 mg | ORAL_TABLET | Freq: Four times a day (QID) | ORAL | Status: DC | PRN

## 2020-08-25 MED ORDER — INFLUENZA VAC A&B SA ADJ QUAD 0.5 ML IM PRSY
0.5000 mL | PREFILLED_SYRINGE | INTRAMUSCULAR | Status: AC
  Administered 2020-08-26: 0.5 mL via INTRAMUSCULAR
  Filled 2020-08-25: qty 0.5

## 2020-08-25 NOTE — ED Notes (Signed)
Report given to [facility] on 300

## 2020-08-25 NOTE — Progress Notes (Signed)
PROGRESS NOTE    Walter Goodwin  GBT:[id number] DOB: July 20, 1917 DOA: 08/24/2020 PCP: Rosita Fire, MD   Brief Narrative:  Per HPI: Walter Goodwin  is a 86 y.o. male, with history of dementia, prostate cancer, hypertension was brought to the hospital by family as patient was unable to get out of bed.  As per patient's daughter he has been weak since yesterday this morning he ate his breakfast but was having difficulty getting out of bed.  There were no symptoms of nausea vomiting or diarrhea.  No fever or chills.  No cough.  No chest pain. Patient is a poor historian with history of dementia and hearing impairment. He denies any pain at this time. In the ED lab work showed abnormal UA, patient was started on Rocephin for UTI. WBC 14.7.  9/14: Patient was admitted with generalized weakness and difficulty ambulating in the setting of UTI and has been started on IV Rocephin empirically.  He was also noted to have some AKI on CKD stage IIIb and has been started on gentle IV fluid.  Creatinine trend is improving this morning.  PT evaluation pending.  Urine cultures pending.  Patient does have history of dementia.  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 -Baseline creatinine actually appears to be 1-1.2 -Creatinine noted to be elevated at 1.62 on admission and therefore patient started on gentle IV fluid -Currently creatinine 1.3 -Home Lasix held, continue IV fluid  Hypertension-elevated -Continue home Norvasc -Plan to add oral hydralazine for significant elevations  Hypothyroidism -Continue Synthroid -TSH 2.4  History of dementia without behavioral disturbance -Continue Aricept, Remeron, and trazodone  DVT prophylaxis: Lovenox Code Status: DNR Family Communication: Tried calling daughter 9/14 with no  response Disposition Plan:  Status is: Inpatient  Remains inpatient appropriate because:IV treatments appropriate due to intensity of illness or inability to take PO and Inpatient level of care appropriate due to severity of illness   Dispo: The patient is from: Home              Anticipated d/c is to: SNF              Anticipated d/c date is: 2 days              Patient currently is not medically stable to d/c.  Patient requires ongoing IV antibiotics treatment and urine culture follow-up.  PT evaluation with potential need for SNF given weakness.  Consultants:   None  Procedures:   See below  Antimicrobials:  Anti-infectives (From admission, onward)   Start     Dose/Rate Route Frequency Ordered Stop   08/25/20 1000  cefTRIAXone (ROCEPHIN) 1 g in sodium chloride 0.9 % 100 mL IVPB        1 g 200 mL/hr over 30 Minutes Intravenous Every 24 hours 08/24/20 2034     08/24/20 1515  cefTRIAXone (ROCEPHIN) 1 g in sodium chloride 0.9 % 100 mL IVPB        1 g 200 mL/hr over 30 Minutes Intravenous  Once 08/24/20 1512 08/24/20 1801      Subjective: Patient seen and evaluated today with no new acute complaints or concerns. No acute concerns or events noted overnight.  Objective: Vitals:   08/25/20 0300 08/25/20 0400 08/25/20 0500 08/25/20 0600  BP: (!) 133/58 (!) 145/59 (!) 141/63 (!) 149/104  Pulse:      Resp: (!) 23 (!) 27 18 (!) 25  Temp:      TempSrc:      SpO2:      Weight:      Height:        Intake/Output Summary (Last 24 hours) at 08/25/2020 0927 Last data filed at 08/24/2020 1801 Gross per 24 hour  Intake 600 ml  Output --  Net 600 ml   Filed Weights   08/24/20 1027  Weight: 95.3 kg    Examination:  General exam: Appears calm and comfortable  Respiratory system: Clear to auscultation. Respiratory effort normal. Cardiovascular system: S1 & S2 heard, RRR.  Gastrointestinal system: Abdomen is nondistended, soft and nontender.  Central nervous system: Alert and  awake Extremities: No edema Skin: No rashes, lesions or ulcers Psychiatry: Judgement and insight appear normal. Mood & affect appropriate.     Data Reviewed: I have personally reviewed following labs and imaging studies  CBC: Recent Labs  Lab 08/24/20 1133 08/25/20 0345  WBC 14.7* 13.9*  NEUTROABS 12.2*  --   HGB 11.7* 11.1*  HCT 37.3* 35.7*  MCV 94.0 95.5  PLT 223 354   Basic Metabolic Panel: Recent Labs  Lab 08/24/20 1133 08/25/20 0735  NA 136 137  K 4.1 3.9  CL 103 107  CO2 23 22  GLUCOSE 103* 93  BUN 22 21  CREATININE 1.62* 1.31*  CALCIUM 9.6 9.0   GFR: Estimated Creatinine Clearance: 35 mL/min (A) (by C-G formula based on SCr of 1.31 mg/dL (H)). Liver Function Tests: Recent Labs  Lab 08/24/20 1133  AST 23  ALT 12  ALKPHOS 61  BILITOT 1.0  PROT 7.3  ALBUMIN 3.6   No results for input(s): LIPASE, AMYLASE in the last 168 hours. No results for input(s): AMMONIA in the last 168 hours. Coagulation Profile: No results for input(s): INR, PROTIME in the last 168 hours. Cardiac Enzymes: No results for input(s): CKTOTAL, CKMB, CKMBINDEX, TROPONINI in the last 168 hours. BNP (last 3 results) No results for input(s): PROBNP in the last 8760 hours. HbA1C: No results for input(s): HGBA1C in the last 72 hours. CBG: Recent Labs  Lab 08/24/20 1608  GLUCAP 87   Lipid Profile: No results for input(s): CHOL, HDL, LDLCALC, TRIG, CHOLHDL, LDLDIRECT in the last 72 hours. Thyroid Function Tests: Recent Labs    08/25/20 0735  TSH 2.438   Anemia Panel: No results for input(s): VITAMINB12, FOLATE, FERRITIN, TIBC, IRON, RETICCTPCT in the last 72 hours. Sepsis Labs: No results for input(s): PROCALCITON, LATICACIDVEN in the last 168 hours.  Recent Results (from the past 240 hour(s))  SARS Coronavirus 2 by RT PCR (hospital order, performed in [facility] hospital lab) Nasopharyngeal Nasopharyngeal Swab     Status: None   Collection Time: 08/24/20 10:33 AM    Specimen: Nasopharyngeal Swab  Result Value Ref Range Status   SARS Coronavirus 2 NEGATIVE NEGATIVE Final    Comment: (NOTE) SARS-CoV-2 target nucleic acids are NOT DETECTED.  The SARS-CoV-2 RNA is generally detectable in upper and lower respiratory specimens during the acute phase of infection. The lowest concentration of SARS-CoV-2 viral copies this assay can detect is 250 copies / mL. A negative result does not preclude SARS-CoV-2 infection and should not be used as the sole basis for treatment or other patient management decisions.  A negative result may occur with improper specimen collection / handling, submission of specimen other than nasopharyngeal swab, presence of viral mutation(s) within the areas targeted  by this assay, and inadequate number of viral copies (<250 copies / mL). A negative result must be combined with clinical observations, patient history, and epidemiological information.  Fact Sheet for Patients:   StrictlyIdeas.no  Fact Sheet for Healthcare Providers: BankingDealers.co.za  This test is not yet approved or  cleared by the [place] FDA and has been authorized for detection and/or diagnosis of SARS-CoV-2 by FDA under an Emergency Use Authorization (EUA).  This EUA will remain in effect (meaning this test can be used) for the duration of the COVID-19 declaration under Section 564(b)(1) of the Act, 21 U.S.C. section 360bbb-3(b)(1), unless the authorization is terminated or revoked sooner.  Performed at [facility], [address] [place], [place] [address]          Radiology Studies: CT Head Wo Contrast  Result Date: 08/24/2020 CLINICAL DATA:  Dementia, generalized weakness EXAM: CT HEAD WITHOUT CONTRAST TECHNIQUE: Contiguous axial images were obtained from the base of the skull through the vertex without intravenous contrast. COMPARISON:  06/25/2017 FINDINGS: Brain: Mild parenchymal volume loss  is commensurate with the patient's age and is stable since prior examination. Mild periventricular white matter changes are present likely reflecting the sequela of small vessel ischemia. Densely calcified 14 mm meningioma is seen adjacent to the left frontal lobe, unchanged. No abnormal intra or extra-axial fluid collection. No abnormal mass effect or midline shift. No evidence of acute intracranial hemorrhage or infarct. Ventricular size is normal. Cerebellum unremarkable. Vascular: Unremarkable Skull: Intact Sinuses/Orbits: Paranasal sinuses are clear. Orbits are unremarkable. Other: Mastoid air cells and middle ear cavities are clear. IMPRESSION: 1. No acute intracranial abnormality. 2. Stable 14 mm calcified meningioma adjacent to the left frontal lobe. 3. Mild parenchymal volume loss and periventricular white matter changes likely reflecting the sequela of small vessel ischemia, stable since prior examination. Electronically Signed   By: Fidela Salisbury MD   On: 08/24/2020 17:40   DG Chest Port 1 View  Result Date: 08/24/2020 CLINICAL DATA:  Weakness, dementia, COVID test pending EXAM: PORTABLE CHEST 1 VIEW COMPARISON:  07/05/2020 FINDINGS: Lungs are essentially clear.  No pleural effusion or pneumothorax. Cardiomegaly. Prominent right paratracheal stripe, vascular when correlating with prior CT. Degenerative changes of the bilateral shoulders. IMPRESSION: No evidence of acute cardiopulmonary disease. Electronically Signed   By: Julian Hy M.D.   On: 08/24/2020 10:58        Scheduled Meds: . amLODipine  10 mg Oral Daily  . atorvastatin  10 mg Oral QHS  . donepezil  10 mg Oral QHS  . enoxaparin (LOVENOX) injection  30 mg Subcutaneous Q24H  . levothyroxine  175 mcg Oral QAC breakfast  . mirtazapine  15 mg Oral QHS  . sertraline  100 mg Oral Daily   Continuous Infusions: . sodium chloride 50 mL/hr at 08/25/20 0524  . cefTRIAXone (ROCEPHIN)  IV       LOS: 1 day    Time spent: 35  minutes    Stephfon Bovey Darleen Crocker, DO [facility]  If 7PM-7AM, please contact night-coverage [email or web address] 08/25/2020, 9:27 AM

## 2020-08-26 LAB — CBC
HCT: 34.1 % — ABNORMAL LOW (ref 39.0–52.0)
Hemoglobin: 10.5 g/dL — ABNORMAL LOW (ref 13.0–17.0)
MCH: 29.2 pg (ref 26.0–34.0)
MCHC: 30.8 g/dL (ref 30.0–36.0)
MCV: 94.7 fL (ref 80.0–100.0)
Platelets: 239 10*3/uL (ref 150–400)
RBC: 3.6 MIL/uL — ABNORMAL LOW (ref 4.22–5.81)
RDW: 15.9 % — ABNORMAL HIGH (ref 11.5–15.5)
WBC: 10.7 10*3/uL — ABNORMAL HIGH (ref 4.0–10.5)
nRBC: 0 % (ref 0.0–0.2)

## 2020-08-26 LAB — BASIC METABOLIC PANEL
Anion gap: 6 (ref 5–15)
BUN: 18 mg/dL (ref 8–23)
CO2: 24 mmol/L (ref 22–32)
Calcium: 9 mg/dL (ref 8.9–10.3)
Chloride: 109 mmol/L (ref 98–111)
Creatinine, Ser: 1.22 mg/dL (ref 0.61–1.24)
GFR calc Af Amer: 56 mL/min — ABNORMAL LOW (ref >60)
GFR calc non Af Amer: 48 mL/min — ABNORMAL LOW (ref >60)
Glucose, Bld: 100 mg/dL — ABNORMAL HIGH (ref 70–99)
Potassium: 4.3 mmol/L (ref 3.5–5.1)
Sodium: 139 mmol/L (ref 135–145)

## 2020-08-26 LAB — MAGNESIUM: Magnesium: 2.1 mg/dL (ref 1.7–2.4)

## 2020-08-26 NOTE — Evaluation (Signed)
Physical Therapy Evaluation Patient Details Name: Walter Goodwin MRN: [id number] DOB: Aug 11, 1917 Today's Date: 08/26/2020   History of Present Illness  Walter Goodwin  is a 86 y.o. male, with history of dementia, prostate cancer, hypertension was brought to the hospital by family as patient was unable to get out of bed.  As per patient's daughter he has been weak since yesterday this morning he ate his breakfast but was having difficulty getting out of bed.  There were no symptoms of nausea vomiting or diarrhea.  No fever or chills.  No cough.  No chest pain.Patient is a poor historian with history of dementia and hearing impairment.He denies any pain at this time.In the ED lab work showed abnormal UA, patient was started on Rocephin for UTI.WBC 14.7.    Clinical Impression  Patient demonstrates fair/good return for sitting up at bedside with use of bed rail, very unsteady on feet with inability to fully lock knees due to weakness, limited to a few side steps due to fall risk and tolerated sitting up on [facility] with his daughter present in room after therapy - nursing staff notified.  Patient will benefit from continued physical therapy in hospital and recommended venue below to increase strength, balance, endurance for safe ADLs and gait.    Follow Up Recommendations SNF    Equipment Recommendations  None recommended by PT    Recommendations for Other Services       Precautions / Restrictions Precautions Precautions: Fall Restrictions Weight Bearing Restrictions: No      Mobility  Bed Mobility Overal bed mobility: Needs Assistance Bed Mobility: Supine to Sit     Supine to sit: Min guard;Min assist     General bed mobility comments: increased time, labored movement  Transfers Overall transfer level: Needs assistance Equipment used: Rolling walker (2 wheeled) Transfers: Sit to/from [facility] Sit to Stand: Min assist;Mod assist Stand pivot transfers: Mod assist        General transfer comment: slow labored movement  Ambulation/Gait Ambulation/Gait assistance: Mod assist;Max assist Gait Distance (Feet): 4 Feet Assistive device: Rolling walker (2 wheeled) Gait Pattern/deviations: Decreased step length - right;Decreased step length - left;Decreased stride length Gait velocity: slow   General Gait Details: limited to 4-5 slow unsteady labored side steps at bedside due weakness and unable to fully lock knees  Stairs            Wheelchair Mobility    Modified Satterly (Stroke Patients Only)       Balance Overall balance assessment: Needs assistance Sitting-balance support: Feet supported;No upper extremity supported Sitting balance-Leahy Scale: Good Sitting balance - Comments: seated at EOB   Standing balance support: During functional activity;Bilateral upper extremity supported Standing balance-Leahy Scale: Poor Standing balance comment: using RW                             Pertinent Vitals/Pain Pain Assessment: No/denies pain    Home Living Family/patient expects to be discharged to:: Private residence Living Arrangements: Spouse/significant other;Children Available Help at Discharge: Family;Available 24 hours/day;Personal care attendant Type of Home: House Home Access: Ramped entrance     Home Layout: One level Home Equipment: [place] - 2 wheels;Walker - 4 wheels;Shower seat - built in;Bedside commode;Wheelchair - manual      Prior Function Level of Independence: Needs assistance   Gait / Transfers Assistance Needed: hosuehold Pharmacist, community  ADL's / Homemaking Assistance Needed: family assist with ADLs, personal care  attendant 3 days/week x 3 hours/day        Hand Dominance        Extremity/Trunk Assessment   Upper Extremity Assessment Upper Extremity Assessment: Generalized weakness    Lower Extremity Assessment Lower Extremity Assessment: Generalized weakness    Cervical / Trunk  Assessment Cervical / Trunk Assessment: Kyphotic  Communication   Communication: No difficulties  Cognition Arousal/Alertness: Awake/alert Behavior During Therapy: WFL for tasks assessed/performed Overall Cognitive Status: History of cognitive impairments - at baseline                                        General Comments      Exercises     Assessment/Plan    PT Assessment Patient needs continued PT services  PT Problem List Decreased strength;Decreased activity tolerance;Decreased balance;Decreased mobility       PT Treatment Interventions Gait training;Stair training;Functional mobility training;Therapeutic activities;Therapeutic exercise;Patient/family education;DME instruction    PT Goals (Current goals can be found in the Care Plan section)  Acute Rehab PT Goals Patient Stated Goal: return home after rehab PT Goal Formulation: With patient/family Time For Goal Achievement: 09/09/20 Potential to Achieve Goals: Good    Frequency Min 3X/week   Barriers to discharge        Co-evaluation               AM-PAC PT "6 Clicks" Mobility  Outcome Measure Help needed turning from your back to your side while in a flat bed without using bedrails?: A Little Help needed moving from lying on your back to sitting on the side of a flat bed without using bedrails?: A Little Help needed moving to and from a bed to a chair (including a wheelchair)?: A Lot Help needed standing up from a chair using your arms (e.g., wheelchair or bedside chair)?: A Lot Help needed to walk in hospital room?: A Lot Help needed climbing 3-5 steps with a railing? : Total 6 Click Score: 13    End of Session   Activity Tolerance: Patient tolerated treatment well;Patient limited by fatigue Patient left: with call bell/phone within reach;with family/visitor present (left sitting on BSC) Nurse Communication: Mobility status PT Visit Diagnosis: Unsteadiness on feet (R26.81);Other  abnormalities of gait and mobility (R26.89);Muscle weakness (generalized) (M62.81)    Time: 0349-1791 PT Time Calculation (min) (ACUTE ONLY): 25 min   Charges:   PT Evaluation $PT Eval Moderate Complexity: 1 Mod PT Treatments $Therapeutic Activity: 23-37 mins        3:19 PM, 08/26/20 Lonell Grandchild, MPT Physical Therapist with [facility] 336 [phone] office [phone] mobile phone

## 2020-08-26 NOTE — Plan of Care (Signed)
  Problem: Acute Rehab PT Goals(only PT should resolve) Goal: Pt Will Go Supine/Side To Sit Outcome: Progressing Flowsheets (Taken 08/26/2020 1524) Pt will go Supine/Side to Sit:  with supervision  with min guard assist Goal: Patient Will Transfer Sit To/From Stand Outcome: Progressing Flowsheets (Taken 08/26/2020 1524) Patient will transfer sit to/from stand: with minimal assist Goal: Pt Will Transfer Bed To Chair/Chair To Bed Outcome: Progressing Flowsheets (Taken 08/26/2020 1524) Pt will Transfer Bed to Chair/Chair to Bed:  with min assist  with mod assist Goal: Pt Will Ambulate Outcome: Progressing Flowsheets (Taken 08/26/2020 1524) Pt will Ambulate:  25 feet  with minimal assist  with moderate assist  with rolling walker  3:25 PM, 08/26/20 Lonell Grandchild, MPT Physical Therapist with [facility] 336 [phone] office [phone] mobile phone

## 2020-08-26 NOTE — TOC Initial Note (Signed)
Transition of Care [facility]) - Initial/Assessment Note    Patient Details  Name: Walter Goodwin MRN: [id number] Date of Birth: 1917-11-24  Transition of Care [facility]) CM/SW Contact:    Shade Flood, LCSW Phone Number: 08/26/2020, 2:38 PM  Clinical Narrative:                  Pt admitted from home. PT recommending SNF rehab.  Spoke with pt's daughter, Remo Lipps, by phone to assess. Pt lives at home with his wife and son. Remo Lipps lives across the street. Discussed PT recommendation with Remo Lipps along with CMS provider options. Remo Lipps states pt/family agreeable to referrals for SNF rehab. Will refer as requested. Anticipating dc tomorrow per MD. Will initiate insurance auth as well.  Will follow.  Expected Discharge Plan: Skilled Nursing Facility Barriers to Discharge: Continued Medical Work up   Patient Goals and CMS Choice Patient states their goals for this hospitalization and ongoing recovery are:: get better CMS Medicare.gov Compare Post Acute Care list provided to:: Patient Represenative (must comment) Choice offered to / list presented to : Adult Children  Expected Discharge Plan and Services Expected Discharge Plan: [place] In-house Referral: Clinical Social Work   Post Acute Care Choice: [place] Living arrangements for the past 2 months: [place]                                      Prior Living Arrangements/Services Living arrangements for the past 2 months: Single Family Home Lives with:: Adult Children, Spouse Patient language and need for interpreter reviewed:: Yes Do you feel safe going back to the place where you live?: Yes      Need for Family Participation in Patient Care: Yes (Comment) Care giver support system in place?: Yes (comment) Current home services: DME Criminal Activity/Legal Involvement Pertinent to Current Situation/Hospitalization: No - Comment as needed  Activities of Daily Living Home Assistive  Devices/Equipment: [place] Rile (specify type) ADL Screening (condition at time of admission) Patient's cognitive ability adequate to safely complete daily activities?: Yes Is the patient deaf or have difficulty hearing?: No Does the patient have difficulty seeing, even when wearing glasses/contacts?: No Does the patient have difficulty concentrating, remembering, or making decisions?: Yes Patient able to express need for assistance with ADLs?: No Does the patient have difficulty dressing or bathing?: Yes Independently performs ADLs?: No Communication: Independent Dressing (OT): Dependent, Needs assistance Is this a change from baseline?: Pre-admission baseline Grooming: Needs assistance Is this a change from baseline?: Pre-admission baseline Feeding: Needs assistance Is this a change from baseline?: Pre-admission baseline Bathing: Needs assistance Is this a change from baseline?: Pre-admission baseline Toileting: Needs assistance Is this a change from baseline?: Pre-admission baseline In/Out Bed: Needs assistance Is this a change from baseline?: Pre-admission baseline Walks in Home: Needs assistance (walker) Is this a change from baseline?: Pre-admission baseline Does the patient have difficulty walking or climbing stairs?: Yes Weakness of Legs: Both Weakness of Arms/Hands: None  Permission Sought/Granted Permission sought to share information with : Chartered certified accountant granted to share information with : Yes, Verbal Permission Granted     Permission granted to share info w AGENCY: snfs        Emotional Assessment       Orientation: : Oriented to Self, Oriented to Place Alcohol / Substance Use: Not Applicable Psych Involvement: No (comment)  Admission diagnosis:  UTI (urinary tract infection) [  N39.0] Urinary tract infection with hematuria, site unspecified [N39.0, R31.9] Patient Active Problem List   Diagnosis Date Noted  . UTI (urinary tract  infection) 08/24/2020  . Dementia ([place]) 06/29/2017  . Anemia 06/29/2017  . Hyperlipidemia 06/29/2017  . Essential hypertension 06/25/2017  . Intertrochanteric fracture of right femur ([place]) 06/25/2017  . Knee pain, right 06/25/2017  . Syncope 06/25/2017   PCP:  Rosita Fire, MD Pharmacy:   [facility], [address] AT [address] [place] [address] Phone: [phone] Fax: [phone]  [place], [address] [place] [address] Phone: [phone] Fax: [phone]     Social Determinants of Health (SDOH) Interventions    Readmission Risk Interventions Readmission Risk Prevention Plan 08/26/2020  Medication Screening Complete  Transportation Screening Complete  Some recent data might be hidden

## 2020-08-26 NOTE — Progress Notes (Signed)
PROGRESS NOTE    Walter Goodwin  HWE:[id number] DOB: [birth year] DOA: 08/24/2020 PCP: Rosita Fire, MD   Brief Narrative:  Per HPI: Walter Goodwin  is a 86 y.o. male, with history of dementia, prostate cancer, hypertension was brought to the hospital by family as patient was unable to get out of bed.  As per patient's daughter he has been weak since yesterday this morning he ate his breakfast but was having difficulty getting out of bed.  There were no symptoms of nausea vomiting or diarrhea.  No fever or chills.  No cough.  No chest pain. Patient is a poor historian with history of dementia and hearing impairment. He denies any pain at this time. In the ED lab work showed abnormal UA, patient was started on Rocephin for UTI. WBC 14.7.  9/14: Patient was admitted with generalized weakness and difficulty ambulating in the setting of UTI and has been started on IV Rocephin empirically.  He was also noted to have some AKI on CKD stage IIIb and has been started on gentle IV fluid.  Creatinine trend is improving this morning.  PT evaluation pending.  Urine cultures pending.  Patient does have history of dementia.  9/15:  Pt eating and drinking better.  Urine culture: E coli.    Assessment & Plan:   Active Problems:   UTI (urinary tract infection)  Generalized weakness secondary to UTI -Started on IV Rocephin  Follow cultures   E coli UTI - awaiting final C&S; continue ceftriaxone IV.    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 -Baseline creatinine actually appears to be 1-1.2 -Creatinine noted to be elevated at 1.62 on admission and therefore patient started on gentle IV fluid -Currently creatinine 1.3 -Home Lasix held, continue IV fluid  Hypertension-elevated  -Continue home Norvasc -hydralazine as needed   Hypothyroidism -Continue Synthroid -TSH 2.4  History of dementia without behavioral disturbance -Continue Aricept, Remeron, and trazodone  DVT prophylaxis: Lovenox Code  Status: DNR Family Communication: Tried calling daughter 9/14, with no response Disposition Plan:  Status is: Inpatient  Remains inpatient appropriate because:IV treatments appropriate due to intensity of illness or inability to take PO and Inpatient level of care appropriate due to severity of illness  Dispo: The patient is from: Home              Anticipated d/c is to: SNF vs home health                Anticipated d/c date is: 1 day              Patient currently is not medically stable to d/c.  Awaiting PT eval, awaiting C&S from urine culture.  Consultants:   None  Procedures:   See below  Antimicrobials:  Anti-infectives (From admission, onward)   Start     Dose/Rate Route Frequency Ordered Stop   08/25/20 1000  cefTRIAXone (ROCEPHIN) 1 g in sodium chloride 0.9 % 100 mL IVPB        1 g 200 mL/hr over 30 Minutes Intravenous Every 24 hours 08/24/20 2034     08/24/20 1515  cefTRIAXone (ROCEPHIN) 1 g in sodium chloride 0.9 % 100 mL IVPB        1 g 200 mL/hr over 30 Minutes Intravenous  Once 08/24/20 1512 08/24/20 1801      Subjective: Patient denies specific complaints.   Objective: Vitals:   08/25/20 1243 08/25/20 1300 08/25/20 2340 08/26/20 0504  BP: (!) 141/58 (!) 135/59 (!) 144/74 (!) 138/59  Pulse: 62 60 88 62  Resp: (!) 22 20    Temp:  98.3 F (36.8 C)  98.7 F (37.1 C)  TempSrc: Axillary Oral  Axillary  SpO2: 98% 97% 93% 97%  Weight: 94.1 kg     Height: 6\' 1"  (1.854 m)       Intake/Output Summary (Last 24 hours) at 08/26/2020 1145 Last data filed at 08/26/2020 0510 Gross per 24 hour  Intake 240 ml  Output 800 ml  Net -560 ml   Filed Weights   08/24/20 1027 08/25/20 1243  Weight: 95.3 kg 94.1 kg    Examination:  General exam: Appears calm and comfortable, eating and sitting up in bed.  Respiratory system: Clear to auscultation. Respiratory effort normal. Cardiovascular system: normal S1 & S2 heard.  Gastrointestinal system: Abdomen is  nondistended, soft and nontender.  Central nervous system: Alert and awake Extremities: No edema Skin: No rashes, lesions or ulcers Psychiatry: dementia. Mood & affect appropriate.   Data Reviewed: I have personally reviewed following labs and imaging studies  CBC: Recent Labs  Lab 08/24/20 1133 08/25/20 0345 08/26/20 0610  WBC 14.7* 13.9* 10.7*  NEUTROABS 12.2*  --   --   HGB 11.7* 11.1* 10.5*  HCT 37.3* 35.7* 34.1*  MCV 94.0 95.5 94.7  PLT 223 221 275   Basic Metabolic Panel: Recent Labs  Lab 08/24/20 1133 08/25/20 0735 08/26/20 0610  NA 136 137 139  K 4.1 3.9 4.3  CL 103 107 109  CO2 23 22 24   GLUCOSE 103* 93 100*  BUN 22 21 18   CREATININE 1.62* 1.31* 1.22  CALCIUM 9.6 9.0 9.0  MG  --   --  2.1   GFR: Estimated Creatinine Clearance: 35.5 mL/min (by C-G formula based on SCr of 1.22 mg/dL). Liver Function Tests: Recent Labs  Lab 08/24/20 1133  AST 23  ALT 12  ALKPHOS 61  BILITOT 1.0  PROT 7.3  ALBUMIN 3.6   No results for input(s): LIPASE, AMYLASE in the last 168 hours. No results for input(s): AMMONIA in the last 168 hours. Coagulation Profile: No results for input(s): INR, PROTIME in the last 168 hours. Cardiac Enzymes: No results for input(s): CKTOTAL, CKMB, CKMBINDEX, TROPONINI in the last 168 hours. BNP (last 3 results) No results for input(s): PROBNP in the last 8760 hours. HbA1C: No results for input(s): HGBA1C in the last 72 hours. CBG: Recent Labs  Lab 08/24/20 1608  GLUCAP 87   Lipid Profile: No results for input(s): CHOL, HDL, LDLCALC, TRIG, CHOLHDL, LDLDIRECT in the last 72 hours. Thyroid Function Tests: Recent Labs    08/25/20 0735  TSH 2.438   Anemia Panel: No results for input(s): VITAMINB12, FOLATE, FERRITIN, TIBC, IRON, RETICCTPCT in the last 72 hours. Sepsis Labs: No results for input(s): PROCALCITON, LATICACIDVEN in the last 168 hours.  Recent Results (from the past 240 hour(s))  SARS Coronavirus 2 by RT PCR (hospital  order, performed in [facility] hospital lab) Nasopharyngeal Nasopharyngeal Swab     Status: None   Collection Time: 08/24/20 10:33 AM   Specimen: Nasopharyngeal Swab  Result Value Ref Range Status   SARS Coronavirus 2 NEGATIVE NEGATIVE Final    Comment: (NOTE) SARS-CoV-2 target nucleic acids are NOT DETECTED.  The SARS-CoV-2 RNA is generally detectable in upper and lower respiratory specimens during the acute phase of infection. The lowest concentration of SARS-CoV-2 viral copies this assay can detect is 250 copies / mL. A negative result does not preclude SARS-CoV-2 infection and should not  be used as the sole basis for treatment or other patient management decisions.  A negative result may occur with improper specimen collection / handling, submission of specimen other than nasopharyngeal swab, presence of viral mutation(s) within the areas targeted by this assay, and inadequate number of viral copies (<250 copies / mL). A negative result must be combined with clinical observations, patient history, and epidemiological information.  Fact Sheet for Patients:   StrictlyIdeas.no  Fact Sheet for Healthcare Providers: BankingDealers.co.za  This test is not yet approved or  cleared by the [place] FDA and has been authorized for detection and/or diagnosis of SARS-CoV-2 by FDA under an Emergency Use Authorization (EUA).  This EUA will remain in effect (meaning this test can be used) for the duration of the COVID-19 declaration under Section 564(b)(1) of the Act, 21 U.S.C. section 360bbb-3(b)(1), unless the authorization is terminated or revoked sooner.  Performed at [facility], [address], [place], [place] [address]   Urine Culture     Status: Abnormal (Preliminary result)   Collection Time: 08/24/20  2:10 PM   Specimen: Urine, Random  Result Value Ref Range Status   Specimen Description   Final    URINE, RANDOM Performed  at [facility], [address] [place], [place] [address]    Special Requests   Final    NONE Performed at [facility], [address] [place], [place] [address]    Culture (A)  Final    >=100,000 COLONIES/mL ESCHERICHIA COLI SUSCEPTIBILITIES TO FOLLOW Performed at [facility] [address], [place], [place] [address]    Report Status PENDING  Incomplete    Radiology Studies: CT Head Wo Contrast  Result Date: 08/24/2020 CLINICAL DATA:  Dementia, generalized weakness EXAM: CT HEAD WITHOUT CONTRAST TECHNIQUE: Contiguous axial images were obtained from the base of the skull through the vertex without intravenous contrast. COMPARISON:  06/25/2017 FINDINGS: Brain: Mild parenchymal volume loss is commensurate with the patient's age and is stable since prior examination. Mild periventricular white matter changes are present likely reflecting the sequela of small vessel ischemia. Densely calcified 14 mm meningioma is seen adjacent to the left frontal lobe, unchanged. No abnormal intra or extra-axial fluid collection. No abnormal mass effect or midline shift. No evidence of acute intracranial hemorrhage or infarct. Ventricular size is normal. Cerebellum unremarkable. Vascular: Unremarkable Skull: Intact Sinuses/Orbits: Paranasal sinuses are clear. Orbits are unremarkable. Other: Mastoid air cells and middle ear cavities are clear. IMPRESSION: 1. No acute intracranial abnormality. 2. Stable 14 mm calcified meningioma adjacent to the left frontal lobe. 3. Mild parenchymal volume loss and periventricular white matter changes likely reflecting the sequela of small vessel ischemia, stable since prior examination. Electronically Signed   By: Fidela Salisbury MD   On: 08/24/2020 17:40   Scheduled Meds: . amLODipine  10 mg Oral Daily  . atorvastatin  10 mg Oral QHS  . donepezil  10 mg Oral QHS  . enoxaparin (LOVENOX) injection  30 mg Subcutaneous Q24H  . levothyroxine  175 mcg Oral QAC breakfast  .  mirtazapine  15 mg Oral QHS  . sertraline  100 mg Oral Daily   Continuous Infusions: . cefTRIAXone (ROCEPHIN)  IV 1 g (08/26/20 1034)     LOS: 2 days   Time spent: 35 minutes  Sabrin Dunlevy Wynetta Emery, MD How to contact the [facility] Attending or Consulting provider [place] or covering provider during after hours [place], for this patient?  1. Check the care team in  [facility] and look for a) attending/consulting [facility] provider listed and b) the [facility] team listed 2. Log into [email or web address] and use [place]'s universal password to access. If you do not have the password, please contact the hospital operator. 3. Locate the [facility] provider you are looking for under [facility] and page to a number that you can be directly reached. 4. If you still have difficulty reaching the provider, please page the [facility] (Director on Call) for the Hospitalists listed on amion for assistance.  If 7PM-7AM, please contact night-coverage [email or web address] 08/26/2020, 11:45 AM

## 2020-08-26 NOTE — NC FL2 (Signed)
[place] LEVEL OF CARE SCREENING TOOL     IDENTIFICATION  Patient Name: Walter Goodwin Birthdate: 11-10-1917 Sex: male Admission Date (Current Location): 08/24/2020  [facility] and [place] Number:  [employer] and Address:  [facility] [address] [place]      Provider Number: [id number]  Attending Physician Name and Address:  Murlean Iba, MD  Relative Name and Phone Number:       Current Level of Care: Hospital Recommended Level of Care: [place] Prior Approval Number:    Date Approved/Denied:   PASRR Number: [id number]  Discharge Plan: SNF    Current Diagnoses: Patient Active Problem List   Diagnosis Date Noted  . UTI (urinary tract infection) 08/24/2020  . Dementia ([place]) 06/29/2017  . Anemia 06/29/2017  . Hyperlipidemia 06/29/2017  . Essential hypertension 06/25/2017  . Intertrochanteric fracture of right femur ([place]) 06/25/2017  . Knee pain, right 06/25/2017  . Syncope 06/25/2017    Orientation RESPIRATION BLADDER Height & Weight     Self, Place  Normal Continent Weight: 207 lb 7.3 oz (94.1 kg) Height:  6\' 1"  (185.4 cm)  BEHAVIORAL SYMPTOMS/MOOD NEUROLOGICAL BOWEL NUTRITION STATUS      Continent Diet (see dc summary)  AMBULATORY STATUS COMMUNICATION OF NEEDS Skin   Extensive Assist Verbally Normal                       Personal Care Assistance Level of Assistance  Bathing, Feeding, Dressing Bathing Assistance: Limited assistance Feeding assistance: Independent Dressing Assistance: Limited assistance     Functional Limitations Info  Sight, Hearing, Speech Sight Info: Adequate Hearing Info: Adequate Speech Info: Adequate    SPECIAL CARE FACTORS FREQUENCY  PT (By licensed PT), OT (By licensed OT)     PT Frequency: 5x week OT Frequency: 3x week            Contractures Contractures Info: Not present    Additional Factors Info  Code Status, Allergies,  Psychotropic Code Status Info: DNR Allergies Info: NKA Psychotropic Info: Zoloft, Remeron         Current Medications (08/26/2020):  This is the current hospital active medication list Current Facility-Administered Medications  Medication Dose Route Frequency Provider Last Rate Last Admin  . acetaminophen (TYLENOL) tablet 650 mg  650 mg Oral Q6H PRN Oswald Hillock, MD      . amLODipine (NORVASC) tablet 10 mg  10 mg Oral Daily Oswald Hillock, MD   10 mg at 08/26/20 1036  . atorvastatin (LIPITOR) tablet 10 mg  10 mg Oral QHS Oswald Hillock, MD   10 mg at 08/25/20 2050  . cefTRIAXone (ROCEPHIN) 1 g in sodium chloride 0.9 % 100 mL IVPB  1 g Intravenous Q24H Oswald Hillock, MD 200 mL/hr at 08/26/20 1034 1 g at 08/26/20 1034  . donepezil (ARICEPT) tablet 10 mg  10 mg Oral QHS Oswald Hillock, MD   10 mg at 08/25/20 2050  . enoxaparin (LOVENOX) injection 30 mg  30 mg Subcutaneous Q24H Oswald Hillock, MD   30 mg at 08/25/20 2051  . hydrALAZINE (APRESOLINE) tablet 25 mg  25 mg Oral Q6H PRN Manuella Ghazi, Pratik D, DO      . levothyroxine (SYNTHROID) tablet 175 mcg  175 mcg Oral QAC breakfast Oswald Hillock, MD   175 mcg at 08/26/20 0856  . mirtazapine (REMERON) tablet 15 mg  15 mg Oral QHS [place], Gagan S,  MD   15 mg at 08/25/20 2051  . ondansetron (ZOFRAN) tablet 4 mg  4 mg Oral Q6H PRN Oswald Hillock, MD       Or  . ondansetron (ZOFRAN) injection 4 mg  4 mg Intravenous Q6H PRN Oswald Hillock, MD      . sertraline (ZOLOFT) tablet 100 mg  100 mg Oral Daily Oswald Hillock, MD   100 mg at 08/26/20 1036  . traZODone (DESYREL) tablet 25 mg  25 mg Oral BID PRN Oswald Hillock, MD         Discharge Medications: Please see discharge summary for a list of discharge medications.  Relevant Imaging Results:  Relevant Lab Results:   Additional Information SSN: [id number], LCSW

## 2020-08-27 ENCOUNTER — Inpatient Hospital Stay
Admission: RE | Admit: 2020-08-27 | Discharge: 2020-09-13 | Disposition: A | Discharge status: Home / Self Care | Payer: Medicare Other | Source: Ambulatory Visit | Attending: Internal Medicine | Admitting: Internal Medicine

## 2020-08-27 DIAGNOSIS — M6281 Muscle weakness (generalized): Secondary | ICD-10-CM | POA: Diagnosis not present

## 2020-08-27 DIAGNOSIS — E785 Hyperlipidemia, unspecified: Secondary | ICD-10-CM | POA: Diagnosis not present

## 2020-08-27 DIAGNOSIS — R55 Syncope and collapse: Secondary | ICD-10-CM | POA: Diagnosis not present

## 2020-08-27 DIAGNOSIS — E039 Hypothyroidism, unspecified: Secondary | ICD-10-CM | POA: Diagnosis not present

## 2020-08-27 DIAGNOSIS — I10 Essential (primary) hypertension: Secondary | ICD-10-CM | POA: Diagnosis not present

## 2020-08-27 DIAGNOSIS — N390 Urinary tract infection, site not specified: Secondary | ICD-10-CM | POA: Diagnosis not present

## 2020-08-27 DIAGNOSIS — R262 Difficulty in walking, not elsewhere classified: Secondary | ICD-10-CM | POA: Diagnosis not present

## 2020-08-27 DIAGNOSIS — F339 Major depressive disorder, recurrent, unspecified: Secondary | ICD-10-CM | POA: Diagnosis not present

## 2020-08-27 DIAGNOSIS — N3000 Acute cystitis without hematuria: Secondary | ICD-10-CM | POA: Diagnosis not present

## 2020-08-27 DIAGNOSIS — D649 Anemia, unspecified: Secondary | ICD-10-CM | POA: Diagnosis not present

## 2020-08-27 DIAGNOSIS — N1832 Chronic kidney disease, stage 3b: Secondary | ICD-10-CM | POA: Diagnosis not present

## 2020-08-27 DIAGNOSIS — N1831 Chronic kidney disease, stage 3a: Secondary | ICD-10-CM | POA: Diagnosis not present

## 2020-08-27 DIAGNOSIS — H9193 Unspecified hearing loss, bilateral: Secondary | ICD-10-CM | POA: Diagnosis not present

## 2020-08-27 DIAGNOSIS — Z741 Need for assistance with personal care: Secondary | ICD-10-CM | POA: Diagnosis not present

## 2020-08-27 DIAGNOSIS — Z23 Encounter for immunization: Secondary | ICD-10-CM | POA: Diagnosis not present

## 2020-08-27 DIAGNOSIS — F0390 Unspecified dementia without behavioral disturbance: Secondary | ICD-10-CM | POA: Diagnosis not present

## 2020-08-27 LAB — URINE CULTURE: Culture: >=100000 — AB

## 2020-08-27 MED ORDER — POTASSIUM CHLORIDE CRYS ER 20 MEQ PO TBCR: 20.0000 meq | EXTENDED_RELEASE_TABLET | ORAL | 0 refills | Status: DC

## 2020-08-27 MED ORDER — FUROSEMIDE 20 MG PO TABS: 20.0000 mg | ORAL_TABLET | ORAL | Status: DC

## 2020-08-27 NOTE — TOC Transition Note (Signed)
Transition of Care [facility]) - CM/SW Discharge Note   Patient Details  Name: Walter Goodwin MRN: [id number] Date of Birth: 10/29/1917  Transition of [facility]) CM/SW Contact:  Shade Flood, LCSW Phone Number: 08/27/2020, 10:30 AM   Clinical Narrative:     Pt stable for dc today per MD. Marilynn Rail received. Updated Kerri at [facility]. Pt will dc to [facility] for short term rehab as planned. RN to call report when pt ready. Updated family.  No other TOC needs for dc.  Final next level of care: Skilled Nursing Facility Barriers to Discharge: Barriers Resolved   Patient Goals and CMS Choice Patient states their goals for this hospitalization and ongoing recovery are:: get better CMS Medicare.gov Compare Post Acute Care list provided to:: Patient Represenative (must comment) Choice offered to / list presented to : Adult Children  Discharge Placement   Existing PASRR number confirmed : 08/27/20          Patient chooses bed at: [facility] Patient to be transferred to facility by: w/c Name of family member notified: Pleas Koch Patient and family notified of of transfer: 08/27/20  Discharge Plan and Services In-house Referral: Clinical Social Work   Post Acute Care Choice: [place]                               Social Determinants of Health (SDOH) Interventions     Readmission Risk Interventions Readmission Risk Prevention Plan 08/26/2020  Medication Screening Complete  Transportation Screening Complete  Some recent data might be hidden

## 2020-08-27 NOTE — Discharge Summary (Addendum)
Physician Discharge Summary  Walter STROTHMAN MGQ:[id number] DOB: March 05, 1917 DOA: 08/24/2020  PCP: Rosita Fire, MD  Admit date: 08/24/2020 Discharge date: 08/27/2020  Admitted From:  Home  Disposition:   SNF   Recommendations for Outpatient Follow-up:  Follow up with PCP in 2 weeks  Discharge Condition: STABLE   CODE STATUS: DNR   DIET: SOFT  Brief Hospitalization Summary: Please see all hospital notes, images, labs for full details of the hospitalization. ADMISSION HPI:  Walter Goodwin  is a 86 y.o. male, with history of dementia, prostate cancer, hypertension was brought to the hospital by family as patient was unable to get out of bed.  As per patient's daughter he has been weak since yesterday this morning he ate his breakfast but was having difficulty getting out of bed.  There were no symptoms of nausea vomiting or diarrhea.  No fever or chills.  No cough.  No chest pain.  Patient is a poor historian with history of dementia and hearing impairment.  He denies any pain at this time.  In the ED lab work showed abnormal UA, patient was started on Rocephin for UTI.  WBC 14.7.  9/14: Patient was admitted with generalized weakness and difficulty ambulating in the setting of UTI and has been started on IV Rocephin empirically.  He was also noted to have some AKI on CKD stage IIIb and has been started on gentle IV fluid.  Creatinine trend is improving this morning.  PT evaluation pending.  Urine cultures pending.  Patient does have history of dementia.   9/15:  Pt eating and drinking better.  Urine culture: E coli.    9/16:  Pt stable to discharge to SNF.  Received final dose of ceftriaxone today.    Assessment & Plan:   Active Problems:   UTI (urinary tract infection)   Generalized weakness secondary to UTI -Started on IV Rocephin  Follow cultures    E coli UTI - sensitive to ceftriaxone and fully treated in hospital.     AKI on CKD stage 3b improved with IV fluids.  -Baseline  creatinine actually appears to be 1-1.2 -Creatinine noted to be elevated at 1.62 on admission and therefore patient started on gentle IV fluid -Currently creatinine 1.22 -resume lower dose home lasix 20 mg every other day with potassium supplement   Hypertension-elevated  -Continue home Norvasc   Hypothyroidism -Continue Synthroid -TSH 2.4   History of dementia without behavioral disturbance -Continue Aricept, Remeron, and trazodone   DVT prophylaxis: Lovenox Code Status: DNR Family Communication: daughter 9/15 updated Disposition Plan: SNF  Status is: Inpatient   Discharge Diagnoses:  Active Problems:   UTI (urinary tract infection)  Discharge Instructions:   Allergies as of 08/27/2020   No Known Allergies      Medication List     STOP taking these medications    promethazine-dextromethorphan 6.25-15 MG/5ML syrup Commonly known as: PROMETHAZINE-DM       TAKE these medications    acetaminophen 325 MG tablet Commonly known as: TYLENOL Take 650 mg by mouth every 6 (six) hours as needed for mild pain.   amLODipine 10 MG tablet Commonly known as: NORVASC Take 10 mg by mouth daily.   atorvastatin 10 MG tablet Commonly known as: LIPITOR Take 10 mg by mouth at bedtime.   Colace 100 MG capsule Generic drug: docusate sodium Take 100 mg by mouth 2 (two) times daily.   donepezil 10 MG tablet Commonly known as: ARICEPT Take 10 mg by mouth at  bedtime.   ferrous sulfate 325 (65 FE) MG tablet Take 325 mg by mouth daily before breakfast.   furosemide 20 MG tablet Commonly known as: LASIX Take 1 tablet (20 mg total) by mouth every other day. Start taking on: August 29, 2020 What changed:  medication strength how much to take when to take this   levothyroxine 175 MCG tablet Commonly known as: SYNTHROID Take 175 mcg by mouth daily before breakfast.   mirtazapine 15 MG tablet Commonly known as: REMERON Take 15 mg by mouth at bedtime.   potassium  chloride SA 20 MEQ tablet Commonly known as: KLOR-CON Take 1 tablet (20 mEq total) by mouth every other day. Start taking on: August 29, 2020 What changed: when to take this   sertraline 100 MG tablet Commonly known as: ZOLOFT Take 100 mg by mouth daily.   traZODone 50 MG tablet Commonly known as: DESYREL Take 25 mg by mouth 2 (two) times daily as needed (agitation).   vitamin B-12 1000 MCG tablet Commonly known as: CYANOCOBALAMIN Take 1,000 mcg by mouth daily.        Contact information for follow-up providers     Rosita Fire, MD. Schedule an appointment as soon as possible for a visit in 2 week(s).   Specialty: Internal Medicine Contact information: [place] [address] [phone]              Contact information for after-discharge care     [facility] .   Service: Skilled Nursing Contact information: [address] [place] [address] [phone]                    No Known Allergies Allergies as of 08/27/2020   No Known Allergies      Medication List     STOP taking these medications    promethazine-dextromethorphan 6.25-15 MG/5ML syrup Commonly known as: PROMETHAZINE-DM       TAKE these medications    acetaminophen 325 MG tablet Commonly known as: TYLENOL Take 650 mg by mouth every 6 (six) hours as needed for mild pain.   amLODipine 10 MG tablet Commonly known as: NORVASC Take 10 mg by mouth daily.   atorvastatin 10 MG tablet Commonly known as: LIPITOR Take 10 mg by mouth at bedtime.   Colace 100 MG capsule Generic drug: docusate sodium Take 100 mg by mouth 2 (two) times daily.   donepezil 10 MG tablet Commonly known as: ARICEPT Take 10 mg by mouth at bedtime.   ferrous sulfate 325 (65 FE) MG tablet Take 325 mg by mouth daily before breakfast.   furosemide 20 MG tablet Commonly known as: LASIX Take 1 tablet (20 mg  total) by mouth every other day. Start taking on: August 29, 2020 What changed:  medication strength how much to take when to take this   levothyroxine 175 MCG tablet Commonly known as: SYNTHROID Take 175 mcg by mouth daily before breakfast.   mirtazapine 15 MG tablet Commonly known as: REMERON Take 15 mg by mouth at bedtime.   potassium chloride SA 20 MEQ tablet Commonly known as: KLOR-CON Take 1 tablet (20 mEq total) by mouth every other day. Start taking on: August 29, 2020 What changed: when to take this   sertraline 100 MG tablet Commonly known as: ZOLOFT Take 100 mg by mouth daily.   traZODone 50 MG tablet Commonly known as: DESYREL Take 25 mg by mouth 2 (  two) times daily as needed (agitation).   vitamin B-12 1000 MCG tablet Commonly known as: CYANOCOBALAMIN Take 1,000 mcg by mouth daily.        Procedures/Studies: CT Head Wo Contrast  Result Date: 08/24/2020 CLINICAL DATA:  Dementia, generalized weakness EXAM: CT HEAD WITHOUT CONTRAST TECHNIQUE: Contiguous axial images were obtained from the base of the skull through the vertex without intravenous contrast. COMPARISON:  06/25/2017 FINDINGS: Brain: Mild parenchymal volume loss is commensurate with the patient's age and is stable since prior examination. Mild periventricular white matter changes are present likely reflecting the sequela of small vessel ischemia. Densely calcified 14 mm meningioma is seen adjacent to the left frontal lobe, unchanged. No abnormal intra or extra-axial fluid collection. No abnormal mass effect or midline shift. No evidence of acute intracranial hemorrhage or infarct. Ventricular size is normal. Cerebellum unremarkable. Vascular: Unremarkable Skull: Intact Sinuses/Orbits: Paranasal sinuses are clear. Orbits are unremarkable. Other: Mastoid air cells and middle ear cavities are clear. IMPRESSION: 1. No acute intracranial abnormality. 2. Stable 14 mm calcified meningioma adjacent to the  left frontal lobe. 3. Mild parenchymal volume loss and periventricular white matter changes likely reflecting the sequela of small vessel ischemia, stable since prior examination. Electronically Signed   By: Fidela Salisbury MD   On: 08/24/2020 17:40   DG Chest Port 1 View  Result Date: 08/24/2020 CLINICAL DATA:  Weakness, dementia, COVID test pending EXAM: PORTABLE CHEST 1 VIEW COMPARISON:  07/05/2020 FINDINGS: Lungs are essentially clear.  No pleural effusion or pneumothorax. Cardiomegaly. Prominent right paratracheal stripe, vascular when correlating with prior CT. Degenerative changes of the bilateral shoulders. IMPRESSION: No evidence of acute cardiopulmonary disease. Electronically Signed   By: Julian Hy M.D.   On: 08/24/2020 10:58     Subjective: Pt without complaints.    Discharge Exam: Vitals:   08/26/20 2026 08/27/20 0520  BP: (!) 110/55 (!) 130/58  Pulse: (!) 58 (!) 57  Resp:  18  Temp: 98.9 F (37.2 C) 97.6 F (36.4 C)  SpO2: 98% 100%   Vitals:   08/26/20 0504 08/26/20 1938 08/26/20 2026 08/27/20 0520  BP: (!) 138/59  (!) 110/55 (!) 130/58  Pulse: 62  (!) 58 (!) 57  Resp:    18  Temp: 98.7 F (37.1 C)  98.9 F (37.2 C) 97.6 F (36.4 C)  TempSrc: Axillary   Axillary  SpO2: 97% 92% 98% 100%  Weight:      Height:       General exam: Appears calm and comfortable, eating and sitting up in bed.  Respiratory system: Clear to auscultation. Respiratory effort normal. Cardiovascular system: normal S1 & S2 heard.  Gastrointestinal system: Abdomen is nondistended, soft and nontender.  Central nervous system: Alert and awake Extremities: No edema Skin: No rashes, lesions or ulcers Psychiatry: dementia. Mood & affect appropriate.     The results of significant diagnostics from this hospitalization (including imaging, microbiology, ancillary and laboratory) are listed below for reference.     Microbiology: Recent Results (from the past 240 hour(s))  SARS  Coronavirus 2 by RT PCR (hospital order, performed in [facility] hospital lab) Nasopharyngeal Nasopharyngeal Swab     Status: None   Collection Time: 08/24/20 10:33 AM   Specimen: Nasopharyngeal Swab  Result Value Ref Range Status   SARS Coronavirus 2 NEGATIVE NEGATIVE Final    Comment: (NOTE) SARS-CoV-2 target nucleic acids are NOT DETECTED.  The SARS-CoV-2 RNA is generally detectable in upper and lower respiratory specimens during the acute phase  of infection. The lowest concentration of SARS-CoV-2 viral copies this assay can detect is 250 copies / mL. A negative result does not preclude SARS-CoV-2 infection and should not be used as the sole basis for treatment or other patient management decisions.  A negative result may occur with improper specimen collection / handling, submission of specimen other than nasopharyngeal swab, presence of viral mutation(s) within the areas targeted by this assay, and inadequate number of viral copies (<250 copies / mL). A negative result must be combined with clinical observations, patient history, and epidemiological information.  Fact Sheet for Patients:   StrictlyIdeas.no  Fact Sheet for Healthcare Providers: BankingDealers.co.za  This test is not yet approved or  cleared by the [place] FDA and has been authorized for detection and/or diagnosis of SARS-CoV-2 by FDA under an Emergency Use Authorization (EUA).  This EUA will remain in effect (meaning this test can be used) for the duration of the COVID-19 declaration under Section 564(b)(1) of the Act, 21 U.S.C. section 360bbb-3(b)(1), unless the authorization is terminated or revoked sooner.  Performed at [facility], [address] [place], [place] [address]   Urine Culture     Status: Abnormal   Collection Time: 08/24/20  2:10 PM   Specimen: Urine, Random  Result Value Ref Range Status   Specimen Description   Final    URINE,  RANDOM Performed at [facility], [address] [place], [place] [address]    Special Requests   Final    NONE Performed at [facility], [address] [place], [place] [address]    Culture >=100,000 COLONIES/mL ESCHERICHIA COLI (A)  Final   Report Status 08/27/2020 FINAL  Final   Organism ID, Bacteria ESCHERICHIA COLI (A)  Final      Susceptibility   Escherichia coli - MIC*    AMPICILLIN 16 INTERMEDIATE Intermediate     CEFAZOLIN <=4 SENSITIVE Sensitive     CEFTRIAXONE <=0.25 SENSITIVE Sensitive     CIPROFLOXACIN >=4 RESISTANT Resistant     GENTAMICIN <=1 SENSITIVE Sensitive     IMIPENEM <=0.25 SENSITIVE Sensitive     NITROFURANTOIN <=16 SENSITIVE Sensitive     TRIMETH/SULFA <=20 SENSITIVE Sensitive     AMPICILLIN/SULBACTAM <=2 SENSITIVE Sensitive     PIP/TAZO <=4 SENSITIVE Sensitive     * >=100,000 COLONIES/mL ESCHERICHIA COLI     Labs: BNP (last 3 results) No results for input(s): BNP in the last 8760 hours. Basic Metabolic Panel: Recent Labs  Lab 08/24/20 1133 08/25/20 0735 08/26/20 0610  NA 136 137 139  K 4.1 3.9 4.3  CL 103 107 109  CO2 23 22 24   GLUCOSE 103* 93 100*  BUN 22 21 18   CREATININE 1.62* 1.31* 1.22  CALCIUM 9.6 9.0 9.0  MG  --   --  2.1   Liver Function Tests: Recent Labs  Lab 08/24/20 1133  AST 23  ALT 12  ALKPHOS 61  BILITOT 1.0  PROT 7.3  ALBUMIN 3.6   No results for input(s): LIPASE, AMYLASE in the last 168 hours. No results for input(s): AMMONIA in the last 168 hours. CBC: Recent Labs  Lab 08/24/20 1133 08/25/20 0345 08/26/20 0610  WBC 14.7* 13.9* 10.7*  NEUTROABS 12.2*  --   --   HGB 11.7* 11.1* 10.5*  HCT 37.3* 35.7* 34.1*  MCV 94.0 95.5 94.7  PLT 223 221 239   Cardiac Enzymes: No results for input(s): CKTOTAL, CKMB, CKMBINDEX, TROPONINI in the last 168 hours. BNP: Invalid input(s): POCBNP CBG: Recent  Labs  Lab 08/24/20 1608  GLUCAP 87   D-Dimer No results for input(s): DDIMER in the last 72 hours. Hgb A1c No  results for input(s): HGBA1C in the last 72 hours. Lipid Profile No results for input(s): CHOL, HDL, LDLCALC, TRIG, CHOLHDL, LDLDIRECT in the last 72 hours. Thyroid function studies Recent Labs    08/25/20 0735  TSH 2.438   Anemia work up No results for input(s): VITAMINB12, FOLATE, FERRITIN, TIBC, IRON, RETICCTPCT in the last 72 hours. Urinalysis    Component Value Date/Time   COLORURINE YELLOW 08/24/2020 1410   APPEARANCEUR CLOUDY (A) 08/24/2020 1410   LABSPEC 1.009 08/24/2020 1410   PHURINE 6.0 08/24/2020 1410   GLUCOSEU NEGATIVE 08/24/2020 1410   HGBUR LARGE (A) 08/24/2020 1410   BILIRUBINUR NEGATIVE 08/24/2020 1410   KETONESUR NEGATIVE 08/24/2020 1410   PROTEINUR 100 (A) 08/24/2020 1410   NITRITE NEGATIVE 08/24/2020 1410   LEUKOCYTESUR LARGE (A) 08/24/2020 1410   Sepsis Labs Invalid input(s): PROCALCITONIN,  WBC,  LACTICIDVEN Microbiology Recent Results (from the past 240 hour(s))  SARS Coronavirus 2 by RT PCR (hospital order, performed in [place] hospital lab) Nasopharyngeal Nasopharyngeal Swab     Status: None   Collection Time: 08/24/20 10:33 AM   Specimen: Nasopharyngeal Swab  Result Value Ref Range Status   SARS Coronavirus 2 NEGATIVE NEGATIVE Final    Comment: (NOTE) SARS-CoV-2 target nucleic acids are NOT DETECTED.  The SARS-CoV-2 RNA is generally detectable in upper and lower respiratory specimens during the acute phase of infection. The lowest concentration of SARS-CoV-2 viral copies this assay can detect is 250 copies / mL. A negative result does not preclude SARS-CoV-2 infection and should not be used as the sole basis for treatment or other patient management decisions.  A negative result may occur with improper specimen collection / handling, submission of specimen other than nasopharyngeal swab, presence of viral mutation(s) within the areas targeted by this assay, and inadequate number of viral copies (<250 copies / mL). A negative result must be  combined with clinical observations, patient history, and epidemiological information.  Fact Sheet for Patients:   StrictlyIdeas.no  Fact Sheet for Healthcare Providers: BankingDealers.co.za  This test is not yet approved or  cleared by the [place] FDA and has been authorized for detection and/or diagnosis of SARS-CoV-2 by FDA under an Emergency Use Authorization (EUA).  This EUA will remain in effect (meaning this test can be used) for the duration of the COVID-19 declaration under Section 564(b)(1) of the Act, 21 U.S.C. section 360bbb-3(b)(1), unless the authorization is terminated or revoked sooner.  Performed at [facility], [address], [place], [place] [address]   Urine Culture     Status: Abnormal   Collection Time: 08/24/20  2:10 PM   Specimen: Urine, Random  Result Value Ref Range Status   Specimen Description   Final    URINE, RANDOM Performed at [facility], [address], [place], [place] [address]    Special Requests   Final    NONE Performed at [facility], [address] [place], [place] [address]    Culture >=100,000 COLONIES/mL ESCHERICHIA COLI (A)  Final   Report Status 08/27/2020 FINAL  Final   Organism ID, Bacteria ESCHERICHIA COLI (A)  Final      Susceptibility   Escherichia coli - MIC*    AMPICILLIN 16 INTERMEDIATE Intermediate     CEFAZOLIN <=4 SENSITIVE Sensitive     CEFTRIAXONE <=0.25 SENSITIVE Sensitive     CIPROFLOXACIN >=4 RESISTANT  Resistant     GENTAMICIN <=1 SENSITIVE Sensitive     IMIPENEM <=0.25 SENSITIVE Sensitive     NITROFURANTOIN <=16 SENSITIVE Sensitive     TRIMETH/SULFA <=20 SENSITIVE Sensitive     AMPICILLIN/SULBACTAM <=2 SENSITIVE Sensitive     PIP/TAZO <=4 SENSITIVE Sensitive     * >=100,000 COLONIES/mL ESCHERICHIA COLI   Time coordinating discharge: 35 mins  SIGNED:  Irwin Brakeman, MD  [facility] 08/27/2020, 10:54 AM How to contact the [facility] Attending or  Consulting provider [place] or covering provider during after hours [place], for this patient?  Check the care team in [facility] and look for a) attending/consulting [facility] provider listed and b) the [facility] team listed Log into [email or web address] and use [place]'s universal password to access. If you do not have the password, please contact the hospital operator. Locate the [facility] provider you are looking for under [facility] and page to a number that you can be directly reached. If you still have difficulty reaching the provider, please page the [facility] (Director on Call) for the Hospitalists listed on amion for assistance.

## 2020-08-28 ENCOUNTER — Non-Acute Institutional Stay (SKILLED_NURSING_FACILITY): Payer: Medicare Other | Admitting: Adult Health

## 2020-08-28 ENCOUNTER — Encounter: Payer: Self-pay | Admitting: Adult Health

## 2020-08-28 DIAGNOSIS — D649 Anemia, unspecified: Secondary | ICD-10-CM | POA: Diagnosis not present

## 2020-08-28 DIAGNOSIS — F0390 Unspecified dementia without behavioral disturbance: Secondary | ICD-10-CM

## 2020-08-28 DIAGNOSIS — N1832 Chronic kidney disease, stage 3b: Secondary | ICD-10-CM

## 2020-08-28 DIAGNOSIS — E038 Other specified hypothyroidism: Secondary | ICD-10-CM

## 2020-08-28 DIAGNOSIS — I10 Essential (primary) hypertension: Secondary | ICD-10-CM | POA: Diagnosis not present

## 2020-08-28 DIAGNOSIS — E785 Hyperlipidemia, unspecified: Secondary | ICD-10-CM | POA: Diagnosis not present

## 2020-08-28 DIAGNOSIS — F339 Major depressive disorder, recurrent, unspecified: Secondary | ICD-10-CM

## 2020-08-28 DIAGNOSIS — K5909 Other constipation: Secondary | ICD-10-CM

## 2020-08-28 DIAGNOSIS — R600 Localized edema: Secondary | ICD-10-CM

## 2020-08-28 NOTE — Progress Notes (Signed)
Location:  [place] Room Number: 143-P Place of Service:  SNF (31)   CODE STATUS: DNR  No Known Allergies  Chief Complaint  Patient presents with  . Hospitalization Follow-up    HPI:  He is a 86 year old man who has been hospitalized from 08-24-20 through 08-27-20. He was taken to the ED for increased weakness. He was admitted to the hospital for e-coli UTI he has completed rocephin. He is here for short term rehab with his goal to return back home. He denies any pain; tells me that he has a good appetite; no problems sleeping at night and feels pretty good. He will continue to be followed for his chronic illnesses including: hypertension; constipation; dementia.   Past Medical History:  Diagnosis Date  . Arthritis   . Cancer [facility]) 1995   prostate  . Dementia ([place])   . Gall stones   . High cholesterol   . Hypertension   . Renal disorder     Past Surgical History:  Procedure Laterality Date  . CHOLECYSTECTOMY    . FRACTURE SURGERY    . INTRAMEDULLARY (IM) NAIL INTERTROCHANTERIC Right 06/26/2017   Procedure: INTRAMEDULLARY (IM) NAIL INTERTROCHANTRIC;  Surgeon: Nicholes Stairs, MD;  Location: [place];  Service: Orthopedics;  Laterality: Right;  . PROSTATE SURGERY  1996   TURP    Social History   Socioeconomic History  . Marital status: Married    Spouse name: Not on file  . Number of children: Not on file  . Years of education: Not on file  . Highest education level: Not on file  Occupational History  . Not on file  Tobacco Use  . Smoking status: Former Smoker    Packs/day: 1.00    Years: 85.00    Pack years: 85.00    Types: Cigarettes    Quit date: 12/12/2006    Years since quitting: 13.7  . Smokeless tobacco: Former Systems developer    Types: Snuff, Chew  Vaping Use  . Vaping Use: Never used  Substance and Sexual Activity  . Alcohol use: No  . Drug use: No  . Sexual activity: Not on file  Other Topics Concern  . Not on file  Social  History Narrative  . Not on file   Social Determinants of Health   Financial Resource Strain:   . Difficulty of Paying Living Expenses: Not on file  Food Insecurity:   . Worried About Charity fundraiser in the Last Year: Not on file  . Ran Out of Food in the Last Year: Not on file  Transportation Needs:   . Lack of Transportation (Medical): Not on file  . Lack of Transportation (Non-Medical): Not on file  Physical Activity:   . Days of Exercise per Week: Not on file  . Minutes of Exercise per Session: Not on file  Stress:   . Feeling of Stress : Not on file  Social Connections:   . Frequency of Communication with Friends and Family: Not on file  . Frequency of Social Gatherings with Friends and Family: Not on file  . Attends Religious Services: Not on file  . Active Member of Clubs or Organizations: Not on file  . Attends Archivist Meetings: Not on file  . Marital Status: Not on file  Intimate Partner Violence:   . Fear of Current or Ex-Partner: Not on file  . Emotionally Abused: Not on file  . Physically Abused: Not on file  . Sexually  Abused: Not on file   No family history on file.    VITAL SIGNS BP (!) 147/63   Pulse (!) 46   Temp 97.9 F (36.6 C) (Oral)   Resp 16   Ht 6\' 2"  (1.88 m)   Wt 208 lb 6.4 oz (94.5 kg)   SpO2 97%   BMI 26.76 kg/m   Outpatient Encounter Medications as of 08/28/2020  Medication Sig  . acetaminophen (TYLENOL) 325 MG tablet Take 650 mg by mouth every 6 (six) hours as needed for mild pain.  Marland Kitchen amLODipine (NORVASC) 10 MG tablet Take 10 mg by mouth daily.  Marland Kitchen atorvastatin (LIPITOR) 10 MG tablet Take 10 mg by mouth at bedtime.   . docusate sodium (COLACE) 100 MG capsule Take 100 mg by mouth 2 (two) times daily.  Marland Kitchen donepezil (ARICEPT) 10 MG tablet Take 10 mg by mouth at bedtime.  . ferrous sulfate 325 (65 FE) MG tablet Take 325 mg by mouth daily before breakfast.  . [START ON 08/29/2020] furosemide (LASIX) 20 MG tablet Take 1 tablet  (20 mg total) by mouth every other day.  . levothyroxine (SYNTHROID) 175 MCG tablet Take 175 mcg by mouth daily before breakfast.   . mirtazapine (REMERON) 15 MG tablet Take 15 mg by mouth at bedtime.  Derrill Memo ON 08/29/2020] potassium chloride SA (KLOR-CON) 20 MEQ tablet Take 1 tablet (20 mEq total) by mouth every other day.  . sertraline (ZOLOFT) 100 MG tablet Take 100 mg by mouth daily.  . traZODone (DESYREL) 50 MG tablet Take 25 mg by mouth 2 (two) times daily as needed (agitation).   . vitamin B-12 (CYANOCOBALAMIN) 1000 MCG tablet Take 1,000 mcg by mouth daily.   No facility-administered encounter medications on file as of 08/28/2020.     SIGNIFICANT DIAGNOSTIC EXAMS  TODAY  08-24-20: chest x-ray: No evidence of acute cardiopulmonary disease   08-24-20: ct of head:  1. No acute intracranial abnormality. 2. Stable 14 mm calcified meningioma adjacent to the left frontal lobe. 3. Mild parenchymal volume loss and periventricular white matter changes likely reflecting the sequela of small vessel ischemia, stable since prior examination.  LABS REVIEWED TODAY;   08-24-20: wbc 14.7; hgb 11.7; hct 37.3; mcv 94.0 plt 223; glucose 103; bun 22; creat 1.62; k+ 4.1; na++ 136; ca 9.6 liver normal albumin 3.6 urine culture: e-coli 08-25-20: tsh 2.438 08-26-20: wbc 10.7; hgb 10.5; hct 34.1; mcv 94.7 plt 239; glucose 100; bun 18; creat 1.22; k+ 4.3; na++ 139; ca 9.0 mag 2.1  Review of Systems  Constitutional: Negative for malaise/fatigue.  Respiratory: Negative for cough and shortness of breath.   Cardiovascular: Negative for chest pain, palpitations and leg swelling.  Gastrointestinal: Negative for abdominal pain, constipation and heartburn.  Musculoskeletal: Negative for back pain, joint pain and myalgias.  Skin: Negative.   Neurological: Negative for dizziness.  Psychiatric/Behavioral: The patient is not nervous/anxious.    Physical Exam Constitutional:      General: He is not in acute  distress.    Appearance: He is well-developed. He is not diaphoretic.  Neck:     Thyroid: No thyromegaly.  Cardiovascular:     Rate and Rhythm: Normal rate and regular rhythm.     Pulses: Normal pulses.     Heart sounds: Normal heart sounds.  Pulmonary:     Effort: Pulmonary effort is normal. No respiratory distress.     Breath sounds: Normal breath sounds.  Abdominal:     General: Bowel sounds are normal. There is  no distension.     Palpations: Abdomen is soft.     Tenderness: There is no abdominal tenderness.  Musculoskeletal:        General: Normal range of motion.     Cervical back: Neck supple.     Right lower leg: No edema.     Left lower leg: No edema.  Lymphadenopathy:     Cervical: No cervical adenopathy.  Skin:    General: Skin is warm and dry.  Neurological:     Mental Status: He is alert. Mental status is at baseline.  Psychiatric:        Mood and Affect: Mood normal.       ASSESSMENT/ PLAN:  TODAY  1. Essential hypertension: is stable b/p 147/63 will continue norvasc 10 mg daily   2. Dementia without behavioral disturbance unspecified dementia type: is stable weight is 208 pounds; will continue aricept 10 mg daily   3. Chronic kidney disease stage 3b: is stable bun 18; creat 1.22 will monitor   4. Hyperlipidemia unspecified hyperlipidemia type: is stable will continue lipitor 10 mg daily  5.  Anemia unspecified anemia type is stable hgb 10.5 will continue iron daily  6. Major depression recurrent chronic: is stable will continue zoloft 100 mg daily remeron 15 mg nightly and has trazodone 25 mg twice daily as needed  7. Chronic constipation: is stable will continue colace twice daily   8. Other specified hypothyroidism: is stable tsh 2.438 will continue synthroid 175 mcg daily '  9. Bilateral lower extremity edema: is stable will continue lasix 20 mg with k+ 20 meq every other day.   Will check cbc     MD is aware of resident's narcotic use and  is in agreement with current plan of care. We will attempt to wean resident as appropriate.  Ok Edwards NP [facility]  Contact [phone] Monday through Friday 8am- 5pm  After hours call [phone]

## 2020-08-31 ENCOUNTER — Encounter (HOSPITAL_COMMUNITY)
Admission: RE | Admit: 2020-08-31 | Discharge: 2020-08-31 | Disposition: A | Discharge status: Home / Self Care | Payer: Medicare Other | Source: Skilled Nursing Facility | Attending: Adult Health | Admitting: Adult Health

## 2020-08-31 DIAGNOSIS — F339 Major depressive disorder, recurrent, unspecified: Secondary | ICD-10-CM | POA: Insufficient documentation

## 2020-08-31 DIAGNOSIS — R600 Localized edema: Secondary | ICD-10-CM | POA: Insufficient documentation

## 2020-08-31 DIAGNOSIS — I10 Essential (primary) hypertension: Secondary | ICD-10-CM | POA: Diagnosis not present

## 2020-08-31 DIAGNOSIS — E039 Hypothyroidism, unspecified: Secondary | ICD-10-CM | POA: Insufficient documentation

## 2020-08-31 DIAGNOSIS — K5909 Other constipation: Secondary | ICD-10-CM | POA: Insufficient documentation

## 2020-08-31 DIAGNOSIS — N1832 Chronic kidney disease, stage 3b: Secondary | ICD-10-CM | POA: Insufficient documentation

## 2020-08-31 DIAGNOSIS — F329 Major depressive disorder, single episode, unspecified: Secondary | ICD-10-CM | POA: Insufficient documentation

## 2020-08-31 LAB — CBC
HCT: 37.2 % — ABNORMAL LOW (ref 39.0–52.0)
Hemoglobin: 11.5 g/dL — ABNORMAL LOW (ref 13.0–17.0)
MCH: 29.2 pg (ref 26.0–34.0)
MCHC: 30.9 g/dL (ref 30.0–36.0)
MCV: 94.4 fL (ref 80.0–100.0)
Platelets: 345 10*3/uL (ref 150–400)
RBC: 3.94 MIL/uL — ABNORMAL LOW (ref 4.22–5.81)
RDW: 15.8 % — ABNORMAL HIGH (ref 11.5–15.5)
WBC: 9.5 10*3/uL (ref 4.0–10.5)
nRBC: 0 % (ref 0.0–0.2)

## 2020-09-02 ENCOUNTER — Non-Acute Institutional Stay (SKILLED_NURSING_FACILITY): Payer: Medicare Other | Admitting: Internal Medicine

## 2020-09-02 ENCOUNTER — Encounter: Payer: Self-pay | Admitting: Internal Medicine

## 2020-09-02 DIAGNOSIS — I10 Essential (primary) hypertension: Secondary | ICD-10-CM

## 2020-09-02 DIAGNOSIS — F0390 Unspecified dementia without behavioral disturbance: Secondary | ICD-10-CM

## 2020-09-02 DIAGNOSIS — N3000 Acute cystitis without hematuria: Secondary | ICD-10-CM

## 2020-09-02 NOTE — Progress Notes (Signed)
NURSING HOME LOCATION: [place] ROOM NUMBER: 143/P   CODE STATUS:DNR    PCP: Rosita Fire, MD   This is a comprehensive admission note to [facility] performed on this date less than 30 days from date of admission. Included are preadmission medical/surgical history; reconciled medication list; family history; social history and comprehensive review of systems.  Corrections and additions to the records were documented. Comprehensive physical exam was also performed. Additionally a clinical summary was entered for each active diagnosis pertinent to this admission in the Problem List to enhance continuity of care.  HPI: He was hospitalized 9/13-9/16/2021 with weakness to point of being bedridden. Dementia precluded completing detailed ROS.  In the ED UA was abnormal & WBC was 14.7 ; Rocephin was empirically initiated.AKI was present superimposed on CKD IIIb; gentle IV fluid infusion was initiated with improvement in renal function.C&S identified E coli. DVT prophylaxis was Lovenox.  Past medical and surgical history: include essential hypertension, hypothyroidism, hx of gall stones,hx of prostate cancer,depression & dyslipidemia. Surgeries include cholecystectomy;IM nailing R hip; & prostate surgery.  Social history:non drinker;former smoker.  He is an Scientist, research (life sciences).  Family history:non contributory due to age (62!)   Review of systems:  Could not be completed due to dementia.  He could give me neither the date even the year or the name of the President.  He could not tell me how old his son is. He denies any active symptoms with special reference to the GU system.  Constitutional: No fever, significant weight change, fatigue  Eyes: No redness, discharge, pain, vision change ENT/mouth: No nasal congestion, purulent discharge, earache, change in hearing, sore throat  Cardiovascular: No chest pain, palpitations, paroxysmal nocturnal dyspnea, claudication, edema   Respiratory: No cough, sputum production, hemoptysis, DOE, significant snoring, apnea Gastrointestinal: No heartburn, dysphagia, abdominal pain, nausea /vomiting, rectal bleeding, melena, change in bowels Genitourinary: No dysuria, hematuria, pyuria, incontinence, nocturia Musculoskeletal: No joint stiffness, joint swelling, weakness, pain Dermatologic: No rash, pruritus, change in appearance of skin Neurologic: No dizziness, headache, syncope, seizures, numbness, tingling Psychiatric: No significant anxiety, depression, insomnia, anorexia Endocrine: No change in hair/skin/nails, excessive thirst, excessive hunger, excessive urination  Hematologic/lymphatic: No significant bruising, lymphadenopathy, abnormal bleeding Allergy/immunology: No itchy/watery eyes, significant sneezing, urticaria, angioedema  Physical exam:  Pertinent or positive findings: He appears decades younger than his stated age.  He has slight pattern alopecia.  Mustache is present.  He has asymmetric arcus senilis with greater presence on the left.  The lower lids are puffy.  Bilateral ptosis is present.  He is completely edentulous.  Heart sounds are markedly distant.  Heart rhythm appears to be irregular clinically.  He has decreased breath sounds with insignificant low-grade rales.  Pedal pulses are decreased.  He has isolated contractures of the fingers.  Valgus deformity of the lower extremities is present.  General appearance: Adequately nourished; no acute distress, increased work of breathing is present.   Lymphatic: No lymphadenopathy about the head, neck, axilla. Eyes: No conjunctival inflammation or lid edema is present. There is no scleral icterus. Ears:  External ear exam shows no significant lesions or deformities.   Nose:  External nasal examination shows no deformity or inflammation. Nasal mucosa are pink and moist without lesions, exudates Oral exam: Lips and gums are healthy appearing.There is no oropharyngeal  erythema or exudate. Neck:  No thyromegaly, masses, tenderness noted.    Heart:  No gallop, murmur, click, rub.  Lungs:  without wheezes, rhonchi, rubs. Abdomen: Bowel  sounds are normal.  Abdomen is soft and nontender with no organomegaly, hernias, masses. GU: Deferred  Extremities:  No cyanosis, clubbing, edema. Neurologic exam:  Strength equal  in upper & lower extremities. Balance, Rhomberg, finger to nose testing could not be completed due to clinical state Skin: Warm & dry w/o tenting. No significant lesions or rash.  See clinical summary under each active problem in the Problem List with associated updated therapeutic plan

## 2020-09-02 NOTE — Patient Instructions (Signed)
See assessment and plan under each diagnosis in the problem list and acutely for this visit 

## 2020-09-02 NOTE — Assessment & Plan Note (Signed)
BP controlled; no change in antihypertensive medications  

## 2020-09-02 NOTE — Assessment & Plan Note (Addendum)
Afebrile; WBC WNL As per Therapy now able to ambulate 60 ft DME needs being assessed due to imbalance in context of being bow legged

## 2020-09-02 NOTE — Assessment & Plan Note (Addendum)
Pleasantly demented w/o behavioral issues; therefore psychiatric polypharmacy will not be deprescribed.

## 2020-09-04 ENCOUNTER — Non-Acute Institutional Stay (SKILLED_NURSING_FACILITY): Payer: Medicare Other | Admitting: Adult Health

## 2020-09-04 ENCOUNTER — Encounter: Payer: Self-pay | Admitting: Adult Health

## 2020-09-04 DIAGNOSIS — I10 Essential (primary) hypertension: Secondary | ICD-10-CM | POA: Diagnosis not present

## 2020-09-04 DIAGNOSIS — F0390 Unspecified dementia without behavioral disturbance: Secondary | ICD-10-CM | POA: Diagnosis not present

## 2020-09-04 DIAGNOSIS — N1832 Chronic kidney disease, stage 3b: Secondary | ICD-10-CM | POA: Diagnosis not present

## 2020-09-04 NOTE — Progress Notes (Signed)
Location:    [place] Room Number: 143-P Place of Service:  SNF (31)   CODE STATUS: DNR  No Known Allergies  Chief Complaint  Patient presents with   Routine Visit         Essential hypertension:    Dementia without behavioral unspecified dementia type:     Chronic kidney disease stage 3b:   Weekly follow up for the first 30 days post hospitalization.     HPI:  He is a 86 year old short term rehab patient being seen for the management of his chronic illnesses:Essential hypertension:    Dementia without behavioral unspecified dementia type:     Chronic kidney disease stage 3b. There are no reports of uncontrolled pain. No reports of agitation or anxiety. He continues to participate in therapy. His goal remains to return back home.   Past Medical History:  Diagnosis Date   Arthritis    Cancer ([place]) 1995   prostate   Dementia ([place])    Gall stones    High cholesterol    Hypertension    Renal disorder     Past Surgical History:  Procedure Laterality Date   CHOLECYSTECTOMY     FRACTURE SURGERY     INTRAMEDULLARY (IM) NAIL INTERTROCHANTERIC Right 06/26/2017   Procedure: INTRAMEDULLARY (IM) NAIL INTERTROCHANTRIC;  Surgeon: Nicholes Stairs, MD;  Location: [place];  Service: Orthopedics;  Laterality: Right;   PROSTATE SURGERY  1996   TURP    Social History   Socioeconomic History   Marital status: Married    Spouse name: Not on file   Number of children: Not on file   Years of education: Not on file   Highest education level: Not on file  Occupational History   Not on file  Tobacco Use   Smoking status: Former Smoker    Packs/day: 1.00    Years: 85.00    Pack years: 85.00    Types: Cigarettes    Quit date: 12/12/2006    Years since quitting: 13.7   Smokeless tobacco: Former Systems developer    Types: Snuff, Database administrator Use   Vaping Use: Never used  Substance and Sexual Activity   Alcohol use: No   Drug use: No   Sexual  activity: Not on file  Other Topics Concern   Not on file  Social History Narrative   Not on file   Social Determinants of Health   Financial Resource Strain:    Difficulty of Paying Living Expenses: Not on file  Food Insecurity:    Worried About Charity fundraiser in the Last Year: Not on file   [employer] of Food in the Last Year: Not on file  Transportation Needs:    Lack of Transportation (Medical): Not on file   Lack of Transportation (Non-Medical): Not on file  Physical Activity:    Days of Exercise per Week: Not on file   Minutes of Exercise per Session: Not on file  Stress:    Feeling of Stress : Not on file  Social Connections:    Frequency of Communication with Friends and Family: Not on file   Frequency of Social Gatherings with Friends and Family: Not on file   Attends Religious Services: Not on file   Active Member of Clubs or Organizations: Not on file   Attends Archivist Meetings: Not on file   Marital Status: Not on file  Intimate Partner Violence:    Fear of Current or  Ex-Partner: Not on file   Emotionally Abused: Not on file   Physically Abused: Not on file   Sexually Abused: Not on file   No family history on file.    VITAL SIGNS BP 140/80    Pulse (!) 52    Temp 98.2 F (36.8 C)    Resp 20    Ht 6\' 2"  (1.88 m)    Wt 208 lb (94.3 kg)    SpO2 97%    BMI 26.71 kg/m   Outpatient Encounter Medications as of 09/04/2020  Medication Sig   acetaminophen (TYLENOL) 325 MG tablet Take 650 mg by mouth every 6 (six) hours as needed for mild pain.   amLODipine (NORVASC) 10 MG tablet Take 10 mg by mouth daily.   atorvastatin (LIPITOR) 10 MG tablet Take 10 mg by mouth at bedtime.    docusate sodium (COLACE) 100 MG capsule Take 100 mg by mouth 2 (two) times daily.   donepezil (ARICEPT) 10 MG tablet Take 10 mg by mouth at bedtime.   ferrous sulfate 325 (65 FE) MG tablet Take 325 mg by mouth daily before breakfast.   furosemide  (LASIX) 20 MG tablet Take 1 tablet (20 mg total) by mouth every other day.   levothyroxine (SYNTHROID) 175 MCG tablet Take 175 mcg by mouth daily before breakfast.    mirtazapine (REMERON) 15 MG tablet Take 15 mg by mouth at bedtime.   NON FORMULARY Diet: Mechanical soft   potassium chloride SA (KLOR-CON) 20 MEQ tablet Take 1 tablet (20 mEq total) by mouth every other day.   sertraline (ZOLOFT) 100 MG tablet Take 100 mg by mouth daily.   traZODone (DESYREL) 50 MG tablet Take 25 mg by mouth 2 (two) times daily as needed (restlessness, agitation, inability to relax or remain calm x 14 days. Attempt and document a non-drug intervention and outcome prior to administering meds).    vitamin B-12 (CYANOCOBALAMIN) 1000 MCG tablet Take 1,000 mcg by mouth daily.   No facility-administered encounter medications on file as of 09/04/2020.     SIGNIFICANT DIAGNOSTIC EXAMS  PREVIOUS   08-24-20: chest x-ray: No evidence of acute cardiopulmonary disease   08-24-20: ct of head:  1. No acute intracranial abnormality. 2. Stable 14 mm calcified meningioma adjacent to the left frontal lobe. 3. Mild parenchymal volume loss and periventricular white matter changes likely reflecting the sequela of small vessel ischemia, stable since prior examination.  NO NEW EXAMS   LABS REVIEWED PREVIOUS;   08-24-20: wbc 14.7; hgb 11.7; hct 37.3; mcv 94.0 plt 223; glucose 103; bun 22; creat 1.62; k+ 4.1; na++ 136; ca 9.6 liver normal albumin 3.6 urine culture: e-coli 08-25-20: tsh 2.438 08-26-20: wbc 10.7; hgb 10.5; hct 34.1; mcv 94.7 plt 239; glucose 100; bun 18; creat 1.22; k+ 4.3; na++ 139; ca 9.0 mag 2.1  TODAY  08-31-20: wbc 9.5; hgb 11.5; hct 37.2; mcv 94.4 plt 345   Review of Systems  Constitutional: Negative for malaise/fatigue.  Respiratory: Negative for cough and shortness of breath.   Cardiovascular: Negative for chest pain, palpitations and leg swelling.  Gastrointestinal: Negative for abdominal pain,  constipation and heartburn.  Musculoskeletal: Negative for back pain, joint pain and myalgias.  Skin: Negative.   Neurological: Negative for dizziness.  Psychiatric/Behavioral: The patient is not nervous/anxious.    Physical Exam Constitutional:      General: He is not in acute distress.    Appearance: He is well-developed. He is not diaphoretic.  Neck:     Thyroid:  No thyromegaly.  Cardiovascular:     Rate and Rhythm: Normal rate and regular rhythm.     Pulses: Normal pulses.     Heart sounds: Normal heart sounds.  Pulmonary:     Effort: Pulmonary effort is normal. No respiratory distress.     Breath sounds: Normal breath sounds.  Abdominal:     General: Bowel sounds are normal. There is no distension.     Palpations: Abdomen is soft.     Tenderness: There is no abdominal tenderness.  Musculoskeletal:        General: Normal range of motion.     Cervical back: Neck supple.     Right lower leg: No edema.     Left lower leg: No edema.  Lymphadenopathy:     Cervical: No cervical adenopathy.  Skin:    General: Skin is warm and dry.  Neurological:     Mental Status: He is alert. Mental status is at baseline.  Psychiatric:        Mood and Affect: Mood normal.      ASSESSMENT/ PLAN:  TODAY  1. Essential hypertension: is stable b/p 140/80 will continue norvasc 10 mg daily   2. Dementia without behavioral unspecified dementia type: is stable weight is 208 pounds will continue aricept 10 mg daily   3. Chronic kidney disease stage 3b: is stable bun 18; creat 1.22 will monitor    PREVIOUS   4. Hyperlipidemia unspecified hyperlipidemia type: is stable will continue lipitor 10 mg daily  5.  Anemia unspecified anemia type is stable hgb 10.5 will continue iron daily  6. Major depression recurrent chronic: is stable will continue zoloft 100 mg daily remeron 15 mg nightly and has trazodone 25 mg twice daily as needed  7. Chronic constipation: is stable will continue colace  twice daily   8. Other specified hypothyroidism: is stable tsh 2.438 will continue synthroid 175 mcg daily '  9. Bilateral lower extremity edema: is stable will continue lasix 20 mg with k+ 20 meq every other day.          MD is aware of resident's narcotic use and is in agreement with current plan of care. We will attempt to wean resident as appropriate.  Ok Edwards NP [facility]  Contact [phone] Monday through Friday 8am- 5pm  After hours call [phone]

## 2020-09-10 ENCOUNTER — Encounter: Payer: Self-pay | Admitting: Adult Health

## 2020-09-10 ENCOUNTER — Non-Acute Institutional Stay (SKILLED_NURSING_FACILITY): Payer: Medicare Other | Admitting: Adult Health

## 2020-09-10 DIAGNOSIS — F0390 Unspecified dementia without behavioral disturbance: Secondary | ICD-10-CM

## 2020-09-10 DIAGNOSIS — I10 Essential (primary) hypertension: Secondary | ICD-10-CM

## 2020-09-10 DIAGNOSIS — F339 Major depressive disorder, recurrent, unspecified: Secondary | ICD-10-CM

## 2020-09-10 NOTE — Progress Notes (Signed)
Location:    [place] Room Number: 143/P Place of Service:  SNF (31)   CODE STATUS: DNR  No Known Allergies  Chief Complaint  Patient presents with  . Acute Visit    Care Plan Meeting    HPI:  We have come together for his care plan meeting. Family present no BIMS 0/30 mood. He requires limited to extensive assist with his adls. No falls. He participates in therapy is ambulating with walker with therapy in hallway. He is frequently incontinent of bladder and bowel; feeds self. He has a good appetite; weight is stable. No reports of uncontrolled pain. He will continue to be followed for his chronic illnesses including: . Dementia without behavioral disturbance unspecified dementia type    Major depression recurrent chronic  Essential hypertension   Past Medical History:  Diagnosis Date  . Arthritis   . Cancer [facility]) 1995   prostate  . Dementia ([place])   . Gall stones   . High cholesterol   . Hypertension   . Renal disorder     Past Surgical History:  Procedure Laterality Date  . CHOLECYSTECTOMY    . FRACTURE SURGERY    . INTRAMEDULLARY (IM) NAIL INTERTROCHANTERIC Right 06/26/2017   Procedure: INTRAMEDULLARY (IM) NAIL INTERTROCHANTRIC;  Surgeon: Nicholes Stairs, MD;  Location: [place];  Service: Orthopedics;  Laterality: Right;  . PROSTATE SURGERY  1996   TURP    Social History   Socioeconomic History  . Marital status: Married    Spouse name: Not on file  . Number of children: Not on file  . Years of education: Not on file  . Highest education level: Not on file  Occupational History  . Not on file  Tobacco Use  . Smoking status: Former Smoker    Packs/day: 1.00    Years: 85.00    Pack years: 85.00    Types: Cigarettes    Quit date: 12/12/2006    Years since quitting: 13.7  . Smokeless tobacco: Former Systems developer    Types: Snuff, Chew  Vaping Use  . Vaping Use: Never used  Substance and Sexual Activity  . Alcohol use: No  . Drug use:  No  . Sexual activity: Not on file  Other Topics Concern  . Not on file  Social History Narrative  . Not on file   Social Determinants of Health   Financial Resource Strain:   . Difficulty of Paying Living Expenses: Not on file  Food Insecurity:   . Worried About Charity fundraiser in the Last Year: Not on file  . Ran Out of Food in the Last Year: Not on file  Transportation Needs:   . Lack of Transportation (Medical): Not on file  . Lack of Transportation (Non-Medical): Not on file  Physical Activity:   . Days of Exercise per Week: Not on file  . Minutes of Exercise per Session: Not on file  Stress:   . Feeling of Stress : Not on file  Social Connections:   . Frequency of Communication with Friends and Family: Not on file  . Frequency of Social Gatherings with Friends and Family: Not on file  . Attends Religious Services: Not on file  . Active Member of Clubs or Organizations: Not on file  . Attends Archivist Meetings: Not on file  . Marital Status: Not on file  Intimate Partner Violence:   . Fear of Current or Ex-Partner: Not on file  . Emotionally Abused: Not  on file  . Physically Abused: Not on file  . Sexually Abused: Not on file   No family history on file.    VITAL SIGNS BP 127/67   Pulse (!) 46   Temp (!) 97.1 F (36.2 C)   Resp 20   Ht 6\' 2"  (1.88 m)   Wt 207 lb 12.8 oz (94.3 kg)   SpO2 97%   BMI 26.68 kg/m   Outpatient Encounter Medications as of 09/10/2020  Medication Sig  . acetaminophen (TYLENOL) 325 MG tablet Take 650 mg by mouth every 6 (six) hours as needed for mild pain.  Marland Kitchen amLODipine (NORVASC) 10 MG tablet Take 10 mg by mouth daily.  Marland Kitchen atorvastatin (LIPITOR) 10 MG tablet Take 10 mg by mouth at bedtime.   . docusate sodium (COLACE) 100 MG capsule Take 100 mg by mouth 2 (two) times daily.  Marland Kitchen donepezil (ARICEPT) 10 MG tablet Take 10 mg by mouth at bedtime.  . ferrous sulfate 325 (65 FE) MG tablet Take 325 mg by mouth daily before  breakfast.  . furosemide (LASIX) 20 MG tablet Take 1 tablet (20 mg total) by mouth every other day.  . levothyroxine (SYNTHROID) 175 MCG tablet Take 175 mcg by mouth daily before breakfast.   . mirtazapine (REMERON) 15 MG tablet Take 15 mg by mouth at bedtime.  . NON FORMULARY Diet: Mechanical soft  . potassium chloride SA (KLOR-CON) 20 MEQ tablet Take 1 tablet (20 mEq total) by mouth every other day.  . sertraline (ZOLOFT) 100 MG tablet Take 100 mg by mouth daily.  . vitamin B-12 (CYANOCOBALAMIN) 1000 MCG tablet Take 1,000 mcg by mouth daily.  . [DISCONTINUED] traZODone (DESYREL) 50 MG tablet Take 25 mg by mouth 2 (two) times daily as needed (restlessness, agitation, inability to relax or remain calm x 14 days. Attempt and document a non-drug intervention and outcome prior to administering meds).    No facility-administered encounter medications on file as of 09/10/2020.     SIGNIFICANT DIAGNOSTIC EXAMS   PREVIOUS   08-24-20: chest x-ray: No evidence of acute cardiopulmonary disease   08-24-20: ct of head:  1. No acute intracranial abnormality. 2. Stable 14 mm calcified meningioma adjacent to the left frontal lobe. 3. Mild parenchymal volume loss and periventricular white matter changes likely reflecting the sequela of small vessel ischemia, stable since prior examination.  NO NEW EXAMS   LABS REVIEWED PREVIOUS;   08-24-20: wbc 14.7; hgb 11.7; hct 37.3; mcv 94.0 plt 223; glucose 103; bun 22; creat 1.62; k+ 4.1; na++ 136; ca 9.6 liver normal albumin 3.6 urine culture: e-coli 08-25-20: tsh 2.438 08-26-20: wbc 10.7; hgb 10.5; hct 34.1; mcv 94.7 plt 239; glucose 100; bun 18; creat 1.22; k+ 4.3; na++ 139; ca 9.0 mag 2.1 08-31-20: wbc 9.5; hgb 11.5; hct 37.2; mcv 94.4 plt 345   NO NEW LABS.   Review of Systems  Constitutional: Negative for malaise/fatigue.  Respiratory: Negative for cough and shortness of breath.   Cardiovascular: Negative for chest pain, palpitations and leg swelling.   Gastrointestinal: Negative for abdominal pain, constipation and heartburn.  Musculoskeletal: Negative for back pain, joint pain and myalgias.  Skin: Negative.   Neurological: Negative for dizziness.  Psychiatric/Behavioral: The patient is not nervous/anxious.     Physical Exam Constitutional:      General: He is not in acute distress.    Appearance: He is well-developed. He is not diaphoretic.  Neck:     Thyroid: No thyromegaly.  Cardiovascular:  Rate and Rhythm: Normal rate and regular rhythm.     Pulses: Normal pulses.     Heart sounds: Normal heart sounds.  Pulmonary:     Effort: Pulmonary effort is normal. No respiratory distress.     Breath sounds: Normal breath sounds.  Abdominal:     General: Bowel sounds are normal. There is no distension.     Palpations: Abdomen is soft.     Tenderness: There is no abdominal tenderness.  Musculoskeletal:        General: Normal range of motion.     Cervical back: Neck supple.     Right lower leg: No edema.     Left lower leg: No edema.  Lymphadenopathy:     Cervical: No cervical adenopathy.  Skin:    General: Skin is warm and dry.  Neurological:     Mental Status: He is alert. Mental status is at baseline.  Psychiatric:        Mood and Affect: Mood normal.      ASSESSMENT/ PLAN:  TODAY  1. Dementia without behavioral disturbance unspecified dementia type 2. Major depression recurrent chronic 3. Essential hypertension  Will continue current plan of care Will continue current medications Will continue therapy as directed His goal remains to return home.     MD is aware of resident's narcotic use and is in agreement with current plan of care. We will attempt to wean resident as appropriate.  Ok Edwards NP [facility]  Contact [phone] Monday through Friday 8am- 5pm  After hours call [phone]

## 2020-09-11 ENCOUNTER — Other Ambulatory Visit: Payer: Self-pay | Admitting: Adult Health

## 2020-09-11 ENCOUNTER — Non-Acute Institutional Stay (SKILLED_NURSING_FACILITY): Payer: Medicare Other | Admitting: Adult Health

## 2020-09-11 ENCOUNTER — Encounter: Payer: Self-pay | Admitting: Adult Health

## 2020-09-11 DIAGNOSIS — F339 Major depressive disorder, recurrent, unspecified: Secondary | ICD-10-CM

## 2020-09-11 DIAGNOSIS — N1832 Chronic kidney disease, stage 3b: Secondary | ICD-10-CM

## 2020-09-11 DIAGNOSIS — N3000 Acute cystitis without hematuria: Secondary | ICD-10-CM | POA: Diagnosis not present

## 2020-09-11 DIAGNOSIS — F0390 Unspecified dementia without behavioral disturbance: Secondary | ICD-10-CM | POA: Diagnosis not present

## 2020-09-11 MED ORDER — SERTRALINE HCL 100 MG PO TABS: 100.0000 mg | ORAL_TABLET | Freq: Every day | ORAL | 0 refills | Status: DC

## 2020-09-11 MED ORDER — ATORVASTATIN CALCIUM 10 MG PO TABS: 10.0000 mg | ORAL_TABLET | Freq: Every day | ORAL | 0 refills | Status: DC

## 2020-09-11 MED ORDER — POTASSIUM CHLORIDE CRYS ER 20 MEQ PO TBCR: 20.0000 meq | EXTENDED_RELEASE_TABLET | ORAL | 0 refills | Status: DC

## 2020-09-11 MED ORDER — FUROSEMIDE 20 MG PO TABS: 20.0000 mg | ORAL_TABLET | ORAL | 0 refills | Status: DC

## 2020-09-11 MED ORDER — DONEPEZIL HCL 10 MG PO TABS: 10.0000 mg | ORAL_TABLET | Freq: Every day | ORAL | 0 refills | Status: DC

## 2020-09-11 MED ORDER — FERROUS SULFATE 325 (65 FE) MG PO TABS: 325.0000 mg | ORAL_TABLET | Freq: Every day | ORAL | 0 refills | Status: DC

## 2020-09-11 MED ORDER — LEVOTHYROXINE SODIUM 175 MCG PO TABS: 175.0000 ug | ORAL_TABLET | Freq: Every day | ORAL | 0 refills | Status: DC

## 2020-09-11 MED ORDER — MIRTAZAPINE 15 MG PO TABS: 15.0000 mg | ORAL_TABLET | Freq: Every day | ORAL | 0 refills | Status: DC

## 2020-09-11 MED ORDER — AMLODIPINE BESYLATE 10 MG PO TABS: 10.0000 mg | ORAL_TABLET | Freq: Every day | ORAL | 0 refills | Status: DC

## 2020-09-11 NOTE — Progress Notes (Signed)
Location:    [place] Room Number: 143/P Place of Service:  SNF (31)    CODE STATUS: DNR  No Known Allergies  Chief Complaint  Patient presents with  . Discharge Note    Discharge Visit    HPI:  He is being discharged to home with home health for pt/ot. He will not need any dme; will need to follow up with his medication provider will need his prescriptions written.  He had been hospitalized for weakness and uti. He was admitted to this facility for short term rehab. He has participated in ot and pt; he has improved greatly and is ready to complete his therapy on a home health basis.     Past Medical History:  Diagnosis Date  . Arthritis   . Cancer [facility]) 1995   prostate  . Dementia ([place])   . Gall stones   . High cholesterol   . Hypertension   . Renal disorder     Past Surgical History:  Procedure Laterality Date  . CHOLECYSTECTOMY    . FRACTURE SURGERY    . INTRAMEDULLARY (IM) NAIL INTERTROCHANTERIC Right 06/26/2017   Procedure: INTRAMEDULLARY (IM) NAIL INTERTROCHANTRIC;  Surgeon: Nicholes Stairs, MD;  Location: [place];  Service: Orthopedics;  Laterality: Right;  . PROSTATE SURGERY  1996   TURP    Social History   Socioeconomic History  . Marital status: Married    Spouse name: Not on file  . Number of children: Not on file  . Years of education: Not on file  . Highest education level: Not on file  Occupational History  . Not on file  Tobacco Use  . Smoking status: Former Smoker    Packs/day: 1.00    Years: 85.00    Pack years: 85.00    Types: Cigarettes    Quit date: 12/12/2006    Years since quitting: 13.7  . Smokeless tobacco: Former Systems developer    Types: Snuff, Chew  Vaping Use  . Vaping Use: Never used  Substance and Sexual Activity  . Alcohol use: No  . Drug use: No  . Sexual activity: Not on file  Other Topics Concern  . Not on file  Social History Narrative  . Not on file   Social Determinants of Health    Financial Resource Strain:   . Difficulty of Paying Living Expenses: Not on file  Food Insecurity:   . Worried About Charity fundraiser in the Last Year: Not on file  . Ran Out of Food in the Last Year: Not on file  Transportation Needs:   . Lack of Transportation (Medical): Not on file  . Lack of Transportation (Non-Medical): Not on file  Physical Activity:   . Days of Exercise per Week: Not on file  . Minutes of Exercise per Session: Not on file  Stress:   . Feeling of Stress : Not on file  Social Connections:   . Frequency of Communication with Friends and Family: Not on file  . Frequency of Social Gatherings with Friends and Family: Not on file  . Attends Religious Services: Not on file  . Active Member of Clubs or Organizations: Not on file  . Attends Archivist Meetings: Not on file  . Marital Status: Not on file  Intimate Partner Violence:   . Fear of Current or Ex-Partner: Not on file  . Emotionally Abused: Not on file  . Physically Abused: Not on file  . Sexually Abused: Not on  file   No family history on file.  VITAL SIGNS BP (!) 145/62   Pulse (!) 50   Temp 97.8 F (36.6 C)   Resp 20   Ht 6\' 2"  (1.88 m)   Wt 210 lb 12.8 oz (95.6 kg)   SpO2 97%   BMI 27.07 kg/m   Patient's Medications  New Prescriptions   No medications on file  Previous Medications   ACETAMINOPHEN (TYLENOL) 325 MG TABLET    Take 650 mg by mouth every 6 (six) hours as needed for mild pain.   AMLODIPINE (NORVASC) 10 MG TABLET    Take 1 tablet (10 mg total) by mouth daily.   ATORVASTATIN (LIPITOR) 10 MG TABLET    Take 1 tablet (10 mg total) by mouth at bedtime.   DOCUSATE SODIUM (COLACE) 100 MG CAPSULE    Take 100 mg by mouth 2 (two) times daily.   DONEPEZIL (ARICEPT) 10 MG TABLET    Take 1 tablet (10 mg total) by mouth at bedtime.   FERROUS SULFATE 325 (65 FE) MG TABLET    Take 1 tablet (325 mg total) by mouth daily before breakfast.   FUROSEMIDE (LASIX) 20 MG TABLET    Take 1  tablet (20 mg total) by mouth every other day.   LEVOTHYROXINE (SYNTHROID) 175 MCG TABLET    Take 1 tablet (175 mcg total) by mouth daily before breakfast.   MIRTAZAPINE (REMERON) 15 MG TABLET    Take 1 tablet (15 mg total) by mouth at bedtime.   NON FORMULARY    Diet: Mechanical soft   POTASSIUM CHLORIDE SA (KLOR-CON) 20 MEQ TABLET    Take 1 tablet (20 mEq total) by mouth every other day.   SERTRALINE (ZOLOFT) 100 MG TABLET    Take 1 tablet (100 mg total) by mouth daily.   VITAMIN B-12 (CYANOCOBALAMIN) 1000 MCG TABLET    Take 1,000 mcg by mouth daily.  Modified Medications   No medications on file  Discontinued Medications   No medications on file     SIGNIFICANT DIAGNOSTIC EXAMS   PREVIOUS   08-24-20: chest x-ray: No evidence of acute cardiopulmonary disease   08-24-20: ct of head:  1. No acute intracranial abnormality. 2. Stable 14 mm calcified meningioma adjacent to the left frontal lobe. 3. Mild parenchymal volume loss and periventricular white matter changes likely reflecting the sequela of small vessel ischemia, stable since prior examination.  NO NEW EXAMS   LABS REVIEWED PREVIOUS;   08-24-20: wbc 14.7; hgb 11.7; hct 37.3; mcv 94.0 plt 223; glucose 103; bun 22; creat 1.62; k+ 4.1; na++ 136; ca 9.6 liver normal albumin 3.6 urine culture: e-coli 08-25-20: tsh 2.438 08-26-20: wbc 10.7; hgb 10.5; hct 34.1; mcv 94.7 plt 239; glucose 100; bun 18; creat 1.22; k+ 4.3; na++ 139; ca 9.0 mag 2.1 08-31-20: wbc 9.5; hgb 11.5; hct 37.2; mcv 94.4 plt 345   NO NEW LABS.   Review of Systems  Constitutional: Negative for malaise/fatigue.  Respiratory: Negative for cough and shortness of breath.   Cardiovascular: Negative for chest pain, palpitations and leg swelling.  Gastrointestinal: Negative for abdominal pain, constipation and heartburn.  Musculoskeletal: Negative for back pain, joint pain and myalgias.  Skin: Negative.   Neurological: Negative for dizziness.   Psychiatric/Behavioral: The patient is not nervous/anxious.     Physical Exam Constitutional:      General: He is not in acute distress.    Appearance: He is well-developed. He is not diaphoretic.  Neck:  Thyroid: No thyromegaly.  Cardiovascular:     Rate and Rhythm: Normal rate and regular rhythm.     Pulses: Normal pulses.     Heart sounds: Normal heart sounds.  Pulmonary:     Effort: Pulmonary effort is normal. No respiratory distress.     Breath sounds: Normal breath sounds.  Abdominal:     General: Bowel sounds are normal. There is no distension.     Palpations: Abdomen is soft.     Tenderness: There is no abdominal tenderness.  Musculoskeletal:        General: Normal range of motion.     Cervical back: Neck supple.     Right lower leg: No edema.     Left lower leg: No edema.  Lymphadenopathy:     Cervical: No cervical adenopathy.  Skin:    General: Skin is warm and dry.  Neurological:     Mental Status: He is alert. Mental status is at baseline.  Psychiatric:        Mood and Affect: Mood normal.       ASSESSMENT/ PLAN:   Patient is being discharged with the following home health services:  Pt/ot to evaluate and treat as indicated for gait balance strength adl training.   Patient is being discharged with the following durable medical equipment:  None needed   Patient has been advised to f/u with their PCP in 1-2 weeks to bring them up to date on their rehab stay.  Social services at facility was responsible for arranging this appointment.  Pt was provided with a 30 day supply of prescriptions for medications and refills must be obtained from their PCP.  For controlled substances, a more limited supply may be provided adequate until PCP appointment only.   A 30 day supply of his prescription medications have been sent to [facility] [address]   Time spent with patient 35 minutes; home health medications dme.    Ok Edwards NP [facility]   Contact [phone] Monday through Friday 8am- 5pm  After hours call [phone]

## 2020-09-11 NOTE — Progress Notes (Deleted)
Location:    [place] Room Number: 143/P Place of Service:  SNF (31)   CODE STATUS: DNR  No Known Allergies  Chief Complaint  Patient presents with  . Discharge Note    Discharge Visit    HPI:    Past Medical History:  Diagnosis Date  . Arthritis   . Cancer [facility]) 1995   prostate  . Dementia ([place])   . Gall stones   . High cholesterol   . Hypertension   . Renal disorder     Past Surgical History:  Procedure Laterality Date  . CHOLECYSTECTOMY    . FRACTURE SURGERY    . INTRAMEDULLARY (IM) NAIL INTERTROCHANTERIC Right 06/26/2017   Procedure: INTRAMEDULLARY (IM) NAIL INTERTROCHANTRIC;  Surgeon: Nicholes Stairs, MD;  Location: [place];  Service: Orthopedics;  Laterality: Right;  . PROSTATE SURGERY  1996   TURP    Social History   Socioeconomic History  . Marital status: Married    Spouse name: Not on file  . Number of children: Not on file  . Years of education: Not on file  . Highest education level: Not on file  Occupational History  . Not on file  Tobacco Use  . Smoking status: Former Smoker    Packs/day: 1.00    Years: 85.00    Pack years: 85.00    Types: Cigarettes    Quit date: 12/12/2006    Years since quitting: 13.7  . Smokeless tobacco: Former Systems developer    Types: Snuff, Chew  Vaping Use  . Vaping Use: Never used  Substance and Sexual Activity  . Alcohol use: No  . Drug use: No  . Sexual activity: Not on file  Other Topics Concern  . Not on file  Social History Narrative  . Not on file   Social Determinants of Health   Financial Resource Strain:   . Difficulty of Paying Living Expenses: Not on file  Food Insecurity:   . Worried About Charity fundraiser in the Last Year: Not on file  . Ran Out of Food in the Last Year: Not on file  Transportation Needs:   . Lack of Transportation (Medical): Not on file  . Lack of Transportation (Non-Medical): Not on file  Physical Activity:   . Days of Exercise per Week: Not on  file  . Minutes of Exercise per Session: Not on file  Stress:   . Feeling of Stress : Not on file  Social Connections:   . Frequency of Communication with Friends and Family: Not on file  . Frequency of Social Gatherings with Friends and Family: Not on file  . Attends Religious Services: Not on file  . Active Member of Clubs or Organizations: Not on file  . Attends Archivist Meetings: Not on file  . Marital Status: Not on file  Intimate Partner Violence:   . Fear of Current or Ex-Partner: Not on file  . Emotionally Abused: Not on file  . Physically Abused: Not on file  . Sexually Abused: Not on file   No family history on file.    VITAL SIGNS BP (!) 145/62   Pulse (!) 50   Temp 97.8 F (36.6 C)   Resp 20   Ht 6\' 2"  (1.88 m)   Wt 210 lb 12.8 oz (95.6 kg)   SpO2 97%   BMI 27.07 kg/m   Outpatient Encounter Medications as of 09/11/2020  Medication Sig  . acetaminophen (TYLENOL) 325 MG tablet Take  650 mg by mouth every 6 (six) hours as needed for mild pain.  Marland Kitchen amLODipine (NORVASC) 10 MG tablet Take 1 tablet (10 mg total) by mouth daily.  Marland Kitchen atorvastatin (LIPITOR) 10 MG tablet Take 1 tablet (10 mg total) by mouth at bedtime.  . docusate sodium (COLACE) 100 MG capsule Take 100 mg by mouth 2 (two) times daily.  Marland Kitchen donepezil (ARICEPT) 10 MG tablet Take 1 tablet (10 mg total) by mouth at bedtime.  . ferrous sulfate 325 (65 FE) MG tablet Take 1 tablet (325 mg total) by mouth daily before breakfast.  . furosemide (LASIX) 20 MG tablet Take 1 tablet (20 mg total) by mouth every other day.  . levothyroxine (SYNTHROID) 175 MCG tablet Take 1 tablet (175 mcg total) by mouth daily before breakfast.  . mirtazapine (REMERON) 15 MG tablet Take 1 tablet (15 mg total) by mouth at bedtime.  . NON FORMULARY Diet: Mechanical soft  . potassium chloride SA (KLOR-CON) 20 MEQ tablet Take 1 tablet (20 mEq total) by mouth every other day.  . sertraline (ZOLOFT) 100 MG tablet Take 1 tablet (100  mg total) by mouth daily.  . vitamin B-12 (CYANOCOBALAMIN) 1000 MCG tablet Take 1,000 mcg by mouth daily.   No facility-administered encounter medications on file as of 09/11/2020.     SIGNIFICANT DIAGNOSTIC EXAMS       ASSESSMENT/ PLAN:    MD is aware of resident's narcotic use and is in agreement with current plan of care. We will attempt to wean resident as appropriate.  Ok Edwards NP [facility]  Contact [phone] Monday through Friday 8am- 5pm  After hours call [phone]

## 2020-09-14 ENCOUNTER — Emergency Department (HOSPITAL_COMMUNITY): Payer: Medicare Other

## 2020-09-14 ENCOUNTER — Other Ambulatory Visit: Payer: Self-pay

## 2020-09-14 ENCOUNTER — Emergency Department (HOSPITAL_COMMUNITY)
Admission: EM | Admit: 2020-09-14 | Discharge: 2020-09-14 | Disposition: A | Discharge status: Home / Self Care | Payer: Medicare Other | Attending: Emergency Medicine | Admitting: Emergency Medicine

## 2020-09-14 ENCOUNTER — Encounter (HOSPITAL_COMMUNITY): Payer: Self-pay

## 2020-09-14 DIAGNOSIS — R404 Transient alteration of awareness: Secondary | ICD-10-CM | POA: Diagnosis not present

## 2020-09-14 DIAGNOSIS — R55 Syncope and collapse: Secondary | ICD-10-CM | POA: Insufficient documentation

## 2020-09-14 DIAGNOSIS — E860 Dehydration: Secondary | ICD-10-CM | POA: Diagnosis not present

## 2020-09-14 DIAGNOSIS — F0390 Unspecified dementia without behavioral disturbance: Secondary | ICD-10-CM | POA: Insufficient documentation

## 2020-09-14 DIAGNOSIS — I129 Hypertensive chronic kidney disease with stage 1 through stage 4 chronic kidney disease, or unspecified chronic kidney disease: Secondary | ICD-10-CM | POA: Insufficient documentation

## 2020-09-14 DIAGNOSIS — R531 Weakness: Secondary | ICD-10-CM | POA: Diagnosis not present

## 2020-09-14 DIAGNOSIS — Z79899 Other long term (current) drug therapy: Secondary | ICD-10-CM | POA: Diagnosis not present

## 2020-09-14 DIAGNOSIS — Z743 Need for continuous supervision: Secondary | ICD-10-CM | POA: Diagnosis not present

## 2020-09-14 DIAGNOSIS — N1832 Chronic kidney disease, stage 3b: Secondary | ICD-10-CM | POA: Diagnosis not present

## 2020-09-14 DIAGNOSIS — E039 Hypothyroidism, unspecified: Secondary | ICD-10-CM | POA: Diagnosis not present

## 2020-09-14 DIAGNOSIS — R0689 Other abnormalities of breathing: Secondary | ICD-10-CM | POA: Diagnosis not present

## 2020-09-14 DIAGNOSIS — Z8546 Personal history of malignant neoplasm of prostate: Secondary | ICD-10-CM | POA: Insufficient documentation

## 2020-09-14 DIAGNOSIS — R42 Dizziness and giddiness: Secondary | ICD-10-CM | POA: Diagnosis present

## 2020-09-14 DIAGNOSIS — I7090 Unspecified atherosclerosis: Secondary | ICD-10-CM | POA: Diagnosis not present

## 2020-09-14 DIAGNOSIS — G9389 Other specified disorders of brain: Secondary | ICD-10-CM | POA: Diagnosis not present

## 2020-09-14 DIAGNOSIS — R6889 Other general symptoms and signs: Secondary | ICD-10-CM | POA: Diagnosis not present

## 2020-09-14 LAB — HEPATIC FUNCTION PANEL
ALT: 20 U/L (ref 0–44)
AST: 20 U/L (ref 15–41)
Albumin: 3.5 g/dL (ref 3.5–5.0)
Alkaline Phosphatase: 69 U/L (ref 38–126)
Bilirubin, Direct: <0.1 mg/dL (ref 0.0–0.2)
Total Bilirubin: 0.3 mg/dL (ref 0.3–1.2)
Total Protein: 6.9 g/dL (ref 6.5–8.1)

## 2020-09-14 LAB — CBC
HCT: 36.9 % — ABNORMAL LOW (ref 39.0–52.0)
Hemoglobin: 11.6 g/dL — ABNORMAL LOW (ref 13.0–17.0)
MCH: 29.7 pg (ref 26.0–34.0)
MCHC: 31.4 g/dL (ref 30.0–36.0)
MCV: 94.6 fL (ref 80.0–100.0)
Platelets: 320 10*3/uL (ref 150–400)
RBC: 3.9 MIL/uL — ABNORMAL LOW (ref 4.22–5.81)
RDW: 16.1 % — ABNORMAL HIGH (ref 11.5–15.5)
WBC: 8.9 10*3/uL (ref 4.0–10.5)
nRBC: 0 % (ref 0.0–0.2)

## 2020-09-14 LAB — TROPONIN I (HIGH SENSITIVITY)
Troponin I (High Sensitivity): 9 ng/L (ref <18)
Troponin I (High Sensitivity): 10 ng/L (ref <18)

## 2020-09-14 LAB — DIFFERENTIAL
Abs Immature Granulocytes: 0.04 10*3/uL (ref 0.00–0.07)
Basophils Absolute: 0.1 10*3/uL (ref 0.0–0.1)
Basophils Relative: 1 %
Eosinophils Absolute: 0.4 10*3/uL (ref 0.0–0.5)
Eosinophils Relative: 4 %
Immature Granulocytes: 0 %
Lymphocytes Relative: 18 %
Lymphs Abs: 1.6 10*3/uL (ref 0.7–4.0)
Monocytes Absolute: 0.9 10*3/uL (ref 0.1–1.0)
Monocytes Relative: 10 %
Neutro Abs: 6 10*3/uL (ref 1.7–7.7)
Neutrophils Relative %: 67 %

## 2020-09-14 LAB — BASIC METABOLIC PANEL
Anion gap: 7 (ref 5–15)
BUN: 25 mg/dL — ABNORMAL HIGH (ref 8–23)
CO2: 26 mmol/L (ref 22–32)
Calcium: 9.7 mg/dL (ref 8.9–10.3)
Chloride: 108 mmol/L (ref 98–111)
Creatinine, Ser: 1.35 mg/dL — ABNORMAL HIGH (ref 0.61–1.24)
GFR calc Af Amer: 49 mL/min — ABNORMAL LOW (ref >60)
GFR calc non Af Amer: 42 mL/min — ABNORMAL LOW (ref >60)
Glucose, Bld: 95 mg/dL (ref 70–99)
Potassium: 4.4 mmol/L (ref 3.5–5.1)
Sodium: 141 mmol/L (ref 135–145)

## 2020-09-14 MED ORDER — SODIUM CHLORIDE 0.9 % IV BOLUS
500.0000 mL | Freq: Once | INTRAVENOUS | Status: AC
  Administered 2020-09-14: 500 mL via INTRAVENOUS

## 2020-09-14 NOTE — ED Triage Notes (Signed)
Pt brought to ED via [facility] for near syncope episode this am after getting out of shower. Pt was orthostatic per EMS.

## 2020-09-14 NOTE — Discharge Instructions (Addendum)
Drink plenty of fluids.  Follow-up with your doctor this week.  Return if any problem

## 2020-09-14 NOTE — ED Provider Notes (Signed)
[facility] EMERGENCY DEPARTMENT Provider Note   CSN: [id number] Arrival date & time: 09/14/20  1007     History Chief Complaint  Patient presents with  . Near Syncope    Walter Goodwin is a 86 y.o. male.  Patient states he had taken a shower.  After the shower he became dizzy and almost passed out.  Patient feels fine now  The history is provided by the patient and the EMS personnel. No language interpreter was used.  Near Syncope This is a new problem. The current episode started 1 to 2 hours ago. The problem occurs rarely. The problem has not changed since onset.Pertinent negatives include no chest pain, no abdominal pain and no headaches. Nothing aggravates the symptoms. Nothing relieves the symptoms. He has tried nothing for the symptoms. The treatment provided no relief.       Past Medical History:  Diagnosis Date  . Arthritis   . Cancer [facility]) 1995   prostate  . Dementia ([place])   . Gall stones   . High cholesterol   . Hypertension   . Renal disorder     Patient Active Problem List   Diagnosis Date Noted  . Chronic kidney disease, stage 3b ([place]) 08/31/2020  . Major depression, recurrent, chronic ([place]) 08/31/2020  . Chronic constipation 08/31/2020  . Hypothyroidism 08/31/2020  . Bilateral lower extremity edema 08/31/2020  . UTI (urinary tract infection) 08/24/2020  . Dementia ([place]) 06/29/2017  . Anemia 06/29/2017  . Hyperlipidemia 06/29/2017  . Essential hypertension 06/25/2017  . Intertrochanteric fracture of right femur ([place]) 06/25/2017  . Knee pain, right 06/25/2017  . Syncope 06/25/2017    Past Surgical History:  Procedure Laterality Date  . CHOLECYSTECTOMY    . FRACTURE SURGERY    . INTRAMEDULLARY (IM) NAIL INTERTROCHANTERIC Right 06/26/2017   Procedure: INTRAMEDULLARY (IM) NAIL INTERTROCHANTRIC;  Surgeon: Nicholes Stairs, MD;  Location: [place];  Service: Orthopedics;  Laterality: Right;  . PROSTATE SURGERY  1996   TURP       No family  history on file.  Social History   Tobacco Use  . Smoking status: Former Smoker    Packs/day: 1.00    Years: 85.00    Pack years: 85.00    Types: Cigarettes    Quit date: 12/12/2006    Years since quitting: 13.7  . Smokeless tobacco: Former Systems developer    Types: Snuff, Chew  Vaping Use  . Vaping Use: Never used  Substance Use Topics  . Alcohol use: No  . Drug use: No    Home Medications Prior to Admission medications   Medication Sig Start Date End Date Taking? Authorizing Provider  acetaminophen (TYLENOL) 325 MG tablet Take 650 mg by mouth every 6 (six) hours as needed for mild pain.   Yes [provider]  amLODipine (NORVASC) 10 MG tablet Take 1 tablet (10 mg total) by mouth daily. 09/11/20  Yes Gerlene Fee, NP  atorvastatin (LIPITOR) 10 MG tablet Take 1 tablet (10 mg total) by mouth at bedtime. 09/11/20  Yes Gerlene Fee, NP  docusate sodium (COLACE) 100 MG capsule Take 100 mg by mouth 2 (two) times daily.   Yes [provider]  donepezil (ARICEPT) 10 MG tablet Take 1 tablet (10 mg total) by mouth at bedtime. 09/11/20  Yes Gerlene Fee, NP  ferrous sulfate 325 (65 FE) MG tablet Take 1 tablet (325 mg total) by mouth daily before breakfast. 09/11/20  Yes Gerlene Fee, NP  furosemide (LASIX)  20 MG tablet Take 1 tablet (20 mg total) by mouth every other day. 09/11/20  Yes Gerlene Fee, NP  levothyroxine (SYNTHROID) 175 MCG tablet Take 1 tablet (175 mcg total) by mouth daily before breakfast. 09/11/20  Yes Gerlene Fee, NP  mirtazapine (REMERON) 15 MG tablet Take 1 tablet (15 mg total) by mouth at bedtime. 09/11/20  Yes Gerlene Fee, NP  potassium chloride SA (KLOR-CON) 20 MEQ tablet Take 1 tablet (20 mEq total) by mouth every other day. 09/11/20  Yes Gerlene Fee, NP  sertraline (ZOLOFT) 100 MG tablet Take 1 tablet (100 mg total) by mouth daily. 09/11/20  Yes Gerlene Fee, NP  traZODone (DESYREL) 50 MG tablet Take 25 mg by mouth 2 (two) times  daily as needed (restlessness, agitation, or inability to relax).   Yes [provider]  vitamin B-12 (CYANOCOBALAMIN) 1000 MCG tablet Take 1,000 mcg by mouth daily.   Yes [provider]  NON FORMULARY Diet: Mechanical soft 08/27/20   [provider]    Allergies    Patient has no known allergies.  Review of Systems   Review of Systems  Constitutional: Negative for appetite change and fatigue.  HENT: Negative for congestion, ear discharge and sinus pressure.   Eyes: Negative for discharge.  Respiratory: Negative for cough.   Cardiovascular: Positive for near-syncope. Negative for chest pain.  Gastrointestinal: Negative for abdominal pain and diarrhea.  Genitourinary: Negative for frequency and hematuria.  Musculoskeletal: Negative for back pain.  Skin: Negative for rash.  Neurological: Positive for dizziness. Negative for seizures and headaches.  Psychiatric/Behavioral: Negative for hallucinations.    Physical Exam Updated Vital Signs BP (!) 172/86   Pulse 82   Temp 98.7 F (37.1 C) (Oral)   Resp (!) 22   Ht 6\' 2"  (1.88 m)   Wt 95.3 kg   SpO2 96%   BMI 26.96 kg/m   Physical Exam Vitals and nursing note reviewed.  Constitutional:      Appearance: He is well-developed.  HENT:     Head: Normocephalic.     Nose: Nose normal.  Eyes:     General: No scleral icterus.    Conjunctiva/sclera: Conjunctivae normal.  Neck:     Thyroid: No thyromegaly.  Cardiovascular:     Rate and Rhythm: Normal rate and regular rhythm.     Heart sounds: No murmur heard.  No friction rub. No gallop.   Pulmonary:     Breath sounds: No stridor. No wheezing or rales.  Chest:     Chest wall: No tenderness.  Abdominal:     General: There is no distension.     Tenderness: There is no abdominal tenderness. There is no rebound.  Musculoskeletal:        General: Normal range of motion.     Cervical back: Neck supple.  Lymphadenopathy:     Cervical: No cervical  adenopathy.  Skin:    Findings: No erythema or rash.  Neurological:     Mental Status: He is oriented to person, place, and time.     Motor: No abnormal muscle tone.     Coordination: Coordination normal.  Psychiatric:        Behavior: Behavior normal.     ED Results / Procedures / Treatments   Labs (all labs ordered are listed, but only abnormal results are displayed) Labs Reviewed  BASIC METABOLIC PANEL - Abnormal; Notable for the following components:      Result Value   BUN  25 (*)    Creatinine, Ser 1.35 (*)    GFR calc non Af Amer 42 (*)    GFR calc Af Amer 49 (*)    All other components within normal limits  CBC - Abnormal; Notable for the following components:   RBC 3.90 (*)    Hemoglobin 11.6 (*)    HCT 36.9 (*)    RDW 16.1 (*)    All other components within normal limits  HEPATIC FUNCTION PANEL  DIFFERENTIAL  TROPONIN I (HIGH SENSITIVITY)  TROPONIN I (HIGH SENSITIVITY)    EKG EKG Interpretation  Date/Time:  Monday September 14 2020 14:22:36 EDT Ventricular Rate:  61 PR Interval:    QRS Duration: 146 QT Interval:  463 QTC Calculation: 467 R Axis:   -55 Text Interpretation: Sinus rhythm Atrial premature complexes RBBB and LAFB Confirmed by Milton Ferguson [phone]) on 09/14/2020 2:40:38 PM   Radiology CT Head Wo Contrast  Result Date: 09/14/2020 CLINICAL DATA:  Near syncope episode. Cerebral hemorrhage suspected. EXAM: CT HEAD WITHOUT CONTRAST TECHNIQUE: Contiguous axial images were obtained from the base of the skull through the vertex without intravenous contrast. COMPARISON:  CT head 08/24/2020 FINDINGS: Brain: No evidence of acute infarction, hemorrhage, or hydrocephalus. Possible trace low density extra-axial fluid collection along the left frontoparietal convexity (versus prominent extra-axial space) where there appears to be mildly flattened gyri, which appears similar to prior. No evidence of acute hemorrhage. Similar mineralized extra-axial, dural-based 14  mm lesion along the left frontal convexity, compatible with a meningioma. Similar patchy white matter hypoattenuation, compatible with chronic microvascular ischemic disease. No midline shift. Basal cisterns are patent. Generalized cerebral volume loss with vacuo ventricular dilation. Vascular: Calcific atherosclerosis. Skull: Normal. Negative for fracture or focal lesion. Sinuses/Orbits: The sinuses are clear. No acute orbital abnormality. Other: No mastoid effusion. IMPRESSION: 1. No evidence of acute intracranial abnormality. 2. Stable 14 mm calcified meningioma along the left frontal convexity. 3. Possible trace low density extra-axial fluid collection along the left frontoparietal convexity versus prominent extra-axial spaces. No substantial mass effect and this finding appears similar to prior. No hyperdensity to suggest acute hemorrhage. Electronically Signed   By: Margaretha Sheffield MD   On: 09/14/2020 11:39   DG Chest Port 1 View  Result Date: 09/14/2020 CLINICAL DATA:  Weakness, syncope. EXAM: PORTABLE CHEST 1 VIEW COMPARISON:  August 24, 2020. FINDINGS: The heart size and mediastinal contours are within normal limits. Both lungs are clear. The visualized skeletal structures are unremarkable. IMPRESSION: No active disease. Electronically Signed   By: Marijo Conception M.D.   On: 09/14/2020 10:43    Procedures Procedures (including critical care time)  Medications Ordered in ED Medications  sodium chloride 0.9 % bolus 500 mL (0 mLs Intravenous Stopped 09/14/20 1423)    ED Course  I have reviewed the triage vital signs and the nursing notes.  Pertinent labs & imaging results that were available during my care of the patient were reviewed by me and considered in my medical decision making (see chart for details).    MDM Rules/Calculators/A&P                          Patient with syncope and dehydration.  Patient was hydrated and feels better.  He wants to go home.  He will be discharged  home follow-up with PCP     This patient presents to the ED for concern of syncope, this involves an extensive number of treatment options,  and is a complaint that carries with it a high risk of complications and morbidity.  The differential diagnosis includes anemia dehydration   Lab Tests:   I Ordered, reviewed, and interpreted labs, which included CBC and chemistries that showed mild elevated creatinine and mild anemia  Medicines ordered:   I ordered medication normal saline for dehydration  Imaging Studies ordered:   I ordered imaging studies which included CT head.  I independently visualized and interpreted imaging which showed no acute disease  Additional history obtained:   Additional history obtained from records  Previous records obtained and reviewed.  Consultations Obtained:     Reevaluation:  After the interventions stated above, I reevaluated the patient and found improved  Critical Interventions:  .   Final Clinical Impression(s) / ED Diagnoses Final diagnoses:  Dehydration    Rx / DC Orders ED Discharge Orders    None       Milton Ferguson, MD 09/16/20 [phone]

## 2020-09-14 NOTE — ED Notes (Signed)
Pt transported to radiology.

## 2020-09-14 NOTE — ED Notes (Signed)
Pt given dinner tray.

## 2020-09-16 ENCOUNTER — Encounter: Payer: Self-pay | Admitting: *Deleted

## 2020-09-16 ENCOUNTER — Other Ambulatory Visit: Payer: Self-pay | Admitting: *Deleted

## 2020-09-16 NOTE — Patient Outreach (Signed)
[facility]) Care Management  09/16/2020  AARAN ENBERG 05/09/1917 [id number]  Telephone outreach for red flag on [email or web address]: has questions.  Talked with pt's wife today, Pleas Koch. Mr. Lema has dementia and is unable to participate in the call.   Mrs. Weigelt is familiar with [facility] services as she is participating with a nurse for disease management. Hubert Azure, RN, is currently assigned. Mrs. Degroff gave permission for me to call weekly this month to follow up on pt progress since he has come home from a month SNF stay and just had an ED visit for dehydration 3 days after SNF discharge.  Mr. Phenix is 86 years old. Medical Hx includes: HTN, hypothyroidism, dementia, hx R hip fx, UTIs, CKD, Depression, hyperlipidemia, anemia.  Mrs. Peets is his primary care provider. She herself is 86 years old and there is a son also at home who can help. Daughter, Remo Lipps, is listed as a contact also.  Patient was recently discharged from hospital and all medications have been reviewed. Outpatient Encounter Medications as of 09/16/2020  Medication Sig  . acetaminophen (TYLENOL) 325 MG tablet Take 650 mg by mouth every 6 (six) hours as needed for mild pain.  Marland Kitchen amLODipine (NORVASC) 10 MG tablet Take 1 tablet (10 mg total) by mouth daily.  Marland Kitchen atorvastatin (LIPITOR) 10 MG tablet Take 1 tablet (10 mg total) by mouth at bedtime.  . docusate sodium (COLACE) 100 MG capsule Take 100 mg by mouth 2 (two) times daily.  Marland Kitchen donepezil (ARICEPT) 10 MG tablet Take 1 tablet (10 mg total) by mouth at bedtime.  . ferrous sulfate 325 (65 FE) MG tablet Take 1 tablet (325 mg total) by mouth daily before breakfast.  . furosemide (LASIX) 20 MG tablet Take 1 tablet (20 mg total) by mouth every other day.  . levothyroxine (SYNTHROID) 175 MCG tablet Take 1 tablet (175 mcg total) by mouth daily before breakfast.  . mirtazapine (REMERON) 15 MG tablet Take 1 tablet (15 mg total) by mouth at bedtime.  . potassium  chloride SA (KLOR-CON) 20 MEQ tablet Take 1 tablet (20 mEq total) by mouth every other day.  . sertraline (ZOLOFT) 100 MG tablet Take 1 tablet (100 mg total) by mouth daily.  . traZODone (DESYREL) 50 MG tablet Take 25 mg by mouth 2 (two) times daily as needed (restlessness, agitation, or inability to relax).  . vitamin B-12 (CYANOCOBALAMIN) 1000 MCG tablet Take 1,000 mcg by mouth daily.  . NON FORMULARY Diet: Mechanical soft   No facility-administered encounter medications on file as of 09/16/2020.   Fall Risk  09/16/2020  Falls in the past year? 1  Number falls in past yr: 1  Injury with Fall? 1  Risk for fall due to : History of fall(s);Impaired balance/gait;Mental status change;Medication side effect  Risk for fall due to: Comment Hx hip fx and syncope  Follow up Falls evaluation completed   Depression screen [facility] 2/9 09/16/2020  Decreased Interest 1  Down, Depressed, Hopeless 1  PHQ - 2 Score 2  Altered sleeping 0  Tired, decreased energy 3  Change in appetite 1  Feeling bad or failure about yourself  0  Trouble concentrating 3  Moving slowly or fidgety/restless 0  Suicidal thoughts 0  PHQ-9 Score 9  Difficult doing work/chores Not difficult at all   Goals Addressed            This Visit's Progress   . Care for the Caregiver       Follow  Up Date 09/21/20  People who care for a person with dementia often feel stress.  Too much stress can be harmful to both of you.  You may feel depressed, exhausted, have trouble sleeping or have health problems.  You need to take care of yourself and reach out for help when you need it.  Notes: Need to discuss who else is helping with pt care and relieving Mrs. Capri of some of the caregiving responsibilities. She is 19 herself. Consider the [facility] center. CCS    . Planning for Long-Term Care       Follow Up Date 10/21/20 Preparing for the future is one of the most important things to do.  Notes: Pt has had 2 ED visits and 2  hospitalizations since July. He has also had 2 SNF stays. Palliative care is a service that would be beneficial at this stage in his life. Will discuss next call with wife. CCS     . Prevent Falls       Follow Up Date  10/21/20 Notes: Pt has hx falls, hip fx, recent SNF stay, ED visit for dehydration, 86 years old. Pt is getting home health PT. Spoke to wife about home safety today and preventing falls. CCS      SUGGESTIONS TO PRIMARY CARE:   Mr. Sobel needs an OV before currently scheduled one in November since he has been in LTC and had an ED visit 3 days after returning home.  Please consider Palliative Care for addItIonal support at this time.  Family needs frequent assessment on how well they are able to care for him at home.  I will be calling weekly for re-assessments.  [name]. [name], MSN, [facility] Gerontological Nurse Practitioner [facility] Care Management [phone]

## 2020-09-18 DIAGNOSIS — N1832 Chronic kidney disease, stage 3b: Secondary | ICD-10-CM | POA: Diagnosis not present

## 2020-09-18 DIAGNOSIS — I129 Hypertensive chronic kidney disease with stage 1 through stage 4 chronic kidney disease, or unspecified chronic kidney disease: Secondary | ICD-10-CM | POA: Diagnosis not present

## 2020-09-18 DIAGNOSIS — D631 Anemia in chronic kidney disease: Secondary | ICD-10-CM | POA: Diagnosis not present

## 2020-09-18 DIAGNOSIS — H9190 Unspecified hearing loss, unspecified ear: Secondary | ICD-10-CM | POA: Diagnosis not present

## 2020-09-21 DIAGNOSIS — H9190 Unspecified hearing loss, unspecified ear: Secondary | ICD-10-CM | POA: Diagnosis not present

## 2020-09-21 DIAGNOSIS — D631 Anemia in chronic kidney disease: Secondary | ICD-10-CM | POA: Diagnosis not present

## 2020-09-21 DIAGNOSIS — N1832 Chronic kidney disease, stage 3b: Secondary | ICD-10-CM | POA: Diagnosis not present

## 2020-09-21 DIAGNOSIS — I129 Hypertensive chronic kidney disease with stage 1 through stage 4 chronic kidney disease, or unspecified chronic kidney disease: Secondary | ICD-10-CM | POA: Diagnosis not present

## 2020-09-22 DIAGNOSIS — H9190 Unspecified hearing loss, unspecified ear: Secondary | ICD-10-CM | POA: Diagnosis not present

## 2020-09-22 DIAGNOSIS — D631 Anemia in chronic kidney disease: Secondary | ICD-10-CM | POA: Diagnosis not present

## 2020-09-22 DIAGNOSIS — I129 Hypertensive chronic kidney disease with stage 1 through stage 4 chronic kidney disease, or unspecified chronic kidney disease: Secondary | ICD-10-CM | POA: Diagnosis not present

## 2020-09-22 DIAGNOSIS — N1832 Chronic kidney disease, stage 3b: Secondary | ICD-10-CM | POA: Diagnosis not present

## 2020-09-23 ENCOUNTER — Other Ambulatory Visit: Payer: Self-pay | Admitting: *Deleted

## 2020-09-23 NOTE — Patient Outreach (Signed)
[facility]) Care Management  09/23/2020  Walter Goodwin 1916-12-13 [id number]  Telephone outreach, Transition of care #2. [name] was not at home and [name] cannot speak for himself, he has dementia. Had hip fx 2 months ago and then rehab. Was home several days, and came back to ED with dehydration. I did speak with [name], pt son who resides with his parents and helps with his care.  I asked [name] some general welfare questions which he answered.   How is your father? He's about the same. Is he eating anything? Yes he's eating good. Is he getting out of bed? Not very much. Has he seen the doctor? No but he is going to the [place] if he would tell [name] I called and for her to call me if she has any questions. I will call again in one week and proceed with the plan of care.  [name]. [name], MSN, [facility] Gerontological Nurse Practitioner [facility] Care Management [phone]

## 2020-09-25 DIAGNOSIS — I129 Hypertensive chronic kidney disease with stage 1 through stage 4 chronic kidney disease, or unspecified chronic kidney disease: Secondary | ICD-10-CM | POA: Diagnosis not present

## 2020-09-25 DIAGNOSIS — D631 Anemia in chronic kidney disease: Secondary | ICD-10-CM | POA: Diagnosis not present

## 2020-09-25 DIAGNOSIS — N1832 Chronic kidney disease, stage 3b: Secondary | ICD-10-CM | POA: Diagnosis not present

## 2020-09-25 DIAGNOSIS — H9190 Unspecified hearing loss, unspecified ear: Secondary | ICD-10-CM | POA: Diagnosis not present

## 2020-09-29 DIAGNOSIS — N1832 Chronic kidney disease, stage 3b: Secondary | ICD-10-CM | POA: Diagnosis not present

## 2020-09-29 DIAGNOSIS — E039 Hypothyroidism, unspecified: Secondary | ICD-10-CM | POA: Diagnosis not present

## 2020-09-29 DIAGNOSIS — I10 Essential (primary) hypertension: Secondary | ICD-10-CM | POA: Diagnosis not present

## 2020-09-29 DIAGNOSIS — I129 Hypertensive chronic kidney disease with stage 1 through stage 4 chronic kidney disease, or unspecified chronic kidney disease: Secondary | ICD-10-CM | POA: Diagnosis not present

## 2020-09-29 DIAGNOSIS — M1990 Unspecified osteoarthritis, unspecified site: Secondary | ICD-10-CM | POA: Diagnosis not present

## 2020-09-29 DIAGNOSIS — H9190 Unspecified hearing loss, unspecified ear: Secondary | ICD-10-CM | POA: Diagnosis not present

## 2020-09-29 DIAGNOSIS — D631 Anemia in chronic kidney disease: Secondary | ICD-10-CM | POA: Diagnosis not present

## 2020-09-30 ENCOUNTER — Other Ambulatory Visit: Payer: Self-pay | Admitting: *Deleted

## 2020-09-30 DIAGNOSIS — H9190 Unspecified hearing loss, unspecified ear: Secondary | ICD-10-CM | POA: Diagnosis not present

## 2020-09-30 DIAGNOSIS — I129 Hypertensive chronic kidney disease with stage 1 through stage 4 chronic kidney disease, or unspecified chronic kidney disease: Secondary | ICD-10-CM | POA: Diagnosis not present

## 2020-09-30 DIAGNOSIS — D631 Anemia in chronic kidney disease: Secondary | ICD-10-CM | POA: Diagnosis not present

## 2020-09-30 DIAGNOSIS — N1832 Chronic kidney disease, stage 3b: Secondary | ICD-10-CM | POA: Diagnosis not present

## 2020-09-30 NOTE — Patient Outreach (Signed)
[facility]) Care Management  09/30/2020  ALEXIOS KEOWN 12-24-1916 [id number]  Transition of care call.  Mrs. Rossa reports her husband saw Dr. Legrand Rams yesterday. No changes in his medical regimen.  Home health is providing PT. Pt is able to get up OOB with assistance and use walker with stand by help.  Pt is incontinent and will not allow changes in his incontinence briefs. He is very high risk for skin breakdown. Mrs. Gartman states that it is easier for them to let Mr. Endicott do what he wants rather than to quarrel with him at this stage of his life.  She says they have everything they need at this time.  Next week NP wants to discuss the possiblity of him going to the [facility] and thinking about Palliative Care.  [name]. [name], MSN, [facility] Gerontological Nurse Practitioner [facility] Care Management [phone]

## 2020-10-02 DIAGNOSIS — I129 Hypertensive chronic kidney disease with stage 1 through stage 4 chronic kidney disease, or unspecified chronic kidney disease: Secondary | ICD-10-CM | POA: Diagnosis not present

## 2020-10-02 DIAGNOSIS — D631 Anemia in chronic kidney disease: Secondary | ICD-10-CM | POA: Diagnosis not present

## 2020-10-02 DIAGNOSIS — H9190 Unspecified hearing loss, unspecified ear: Secondary | ICD-10-CM | POA: Diagnosis not present

## 2020-10-02 DIAGNOSIS — N1832 Chronic kidney disease, stage 3b: Secondary | ICD-10-CM | POA: Diagnosis not present

## 2020-10-06 DIAGNOSIS — N1832 Chronic kidney disease, stage 3b: Secondary | ICD-10-CM | POA: Diagnosis not present

## 2020-10-06 DIAGNOSIS — D631 Anemia in chronic kidney disease: Secondary | ICD-10-CM | POA: Diagnosis not present

## 2020-10-06 DIAGNOSIS — H9190 Unspecified hearing loss, unspecified ear: Secondary | ICD-10-CM | POA: Diagnosis not present

## 2020-10-06 DIAGNOSIS — I129 Hypertensive chronic kidney disease with stage 1 through stage 4 chronic kidney disease, or unspecified chronic kidney disease: Secondary | ICD-10-CM | POA: Diagnosis not present

## 2020-10-07 ENCOUNTER — Other Ambulatory Visit: Payer: Self-pay | Admitting: *Deleted

## 2020-10-07 DIAGNOSIS — H9190 Unspecified hearing loss, unspecified ear: Secondary | ICD-10-CM | POA: Diagnosis not present

## 2020-10-07 DIAGNOSIS — D631 Anemia in chronic kidney disease: Secondary | ICD-10-CM | POA: Diagnosis not present

## 2020-10-07 DIAGNOSIS — N1832 Chronic kidney disease, stage 3b: Secondary | ICD-10-CM | POA: Diagnosis not present

## 2020-10-07 DIAGNOSIS — I129 Hypertensive chronic kidney disease with stage 1 through stage 4 chronic kidney disease, or unspecified chronic kidney disease: Secondary | ICD-10-CM | POA: Diagnosis not present

## 2020-10-07 NOTE — Patient Outreach (Signed)
[place]) Care Management  10/07/2020  VIRGINIA FRANCISCO [birth year] [id number]   Telephone outreach:  Spoke with Mrs. Fuelling today. She reports Mr. Seoane is a little stronger. Therapy is still working with him. She says he just will do what he wants. He is too hard for me to handle sometimes.   She is expecting a call from the [place] next week to discuss his medication. She says she hopes they don't take him off of one, or he would really be hard to handle. He is taking sertraline 100 mg daily, trazodone 25 mg bid as needed for restlessness, mirtazapine 15 mg hs and donepezil 10 mg, for mood, sleep, depression and dementia.  Reinforced to avoid confrontation with him and to try to get him to do things, like walking, by telling him there is something interesting to look at outside. Avoid overstimulation by loud noises and dramatic or violent TV.  Encouraged her to get out of the house for a break. She can call me to vent her feelings. NP to call her again in one week.  [name]. [name], MSN, [facility] Gerontological Nurse Practitioner [facility] Care Management [phone]     [name]. [name], MSN, [facility] Gerontological Nurse Practitioner [facility] Care Management [phone]

## 2020-10-08 DIAGNOSIS — H9190 Unspecified hearing loss, unspecified ear: Secondary | ICD-10-CM | POA: Diagnosis not present

## 2020-10-08 DIAGNOSIS — N1832 Chronic kidney disease, stage 3b: Secondary | ICD-10-CM | POA: Diagnosis not present

## 2020-10-08 DIAGNOSIS — I129 Hypertensive chronic kidney disease with stage 1 through stage 4 chronic kidney disease, or unspecified chronic kidney disease: Secondary | ICD-10-CM | POA: Diagnosis not present

## 2020-10-08 DIAGNOSIS — D631 Anemia in chronic kidney disease: Secondary | ICD-10-CM | POA: Diagnosis not present

## 2020-10-14 ENCOUNTER — Other Ambulatory Visit: Payer: Self-pay | Admitting: *Deleted

## 2020-10-14 ENCOUNTER — Other Ambulatory Visit: Payer: Self-pay

## 2020-10-14 DIAGNOSIS — N1832 Chronic kidney disease, stage 3b: Secondary | ICD-10-CM | POA: Diagnosis not present

## 2020-10-14 DIAGNOSIS — I129 Hypertensive chronic kidney disease with stage 1 through stage 4 chronic kidney disease, or unspecified chronic kidney disease: Secondary | ICD-10-CM | POA: Diagnosis not present

## 2020-10-14 DIAGNOSIS — D631 Anemia in chronic kidney disease: Secondary | ICD-10-CM | POA: Diagnosis not present

## 2020-10-14 DIAGNOSIS — H9190 Unspecified hearing loss, unspecified ear: Secondary | ICD-10-CM | POA: Diagnosis not present

## 2020-10-14 NOTE — Patient Outreach (Signed)
[place] [facility]) Care Management  10/14/2020  Walter Goodwin 09-10-1917 [id number]  Telephone outreach.  Mrs. Hyson reports [name] is OK. They had a bad morning, he didn't want to take his thyroid medication and was hateful and threatening when Mrs. Mcwatters tried to coax him to get up. She just had to let him be until later when he was ready to get up.  Asked if providing [name] care is getting to be too much. She said some days it feels like that but thank goodness my son is here to help me.  NP to call again next week.  [name]. [name], MSN, [facility] Gerontological Nurse Practitioner [facility] Care Management [phone]

## 2020-10-19 DIAGNOSIS — H9190 Unspecified hearing loss, unspecified ear: Secondary | ICD-10-CM | POA: Diagnosis not present

## 2020-10-19 DIAGNOSIS — D631 Anemia in chronic kidney disease: Secondary | ICD-10-CM | POA: Diagnosis not present

## 2020-10-19 DIAGNOSIS — N1832 Chronic kidney disease, stage 3b: Secondary | ICD-10-CM | POA: Diagnosis not present

## 2020-10-19 DIAGNOSIS — I129 Hypertensive chronic kidney disease with stage 1 through stage 4 chronic kidney disease, or unspecified chronic kidney disease: Secondary | ICD-10-CM | POA: Diagnosis not present

## 2020-10-21 ENCOUNTER — Ambulatory Visit: Payer: Self-pay | Admitting: *Deleted

## 2020-10-30 DIAGNOSIS — I10 Essential (primary) hypertension: Secondary | ICD-10-CM | POA: Diagnosis not present

## 2020-10-30 DIAGNOSIS — M1990 Unspecified osteoarthritis, unspecified site: Secondary | ICD-10-CM | POA: Diagnosis not present

## 2020-11-02 DIAGNOSIS — I10 Essential (primary) hypertension: Secondary | ICD-10-CM | POA: Diagnosis not present

## 2020-11-02 DIAGNOSIS — E039 Hypothyroidism, unspecified: Secondary | ICD-10-CM | POA: Diagnosis not present

## 2020-12-02 DIAGNOSIS — I10 Essential (primary) hypertension: Secondary | ICD-10-CM | POA: Diagnosis not present

## 2020-12-02 DIAGNOSIS — M1990 Unspecified osteoarthritis, unspecified site: Secondary | ICD-10-CM | POA: Diagnosis not present

## 2021-01-02 DIAGNOSIS — E785 Hyperlipidemia, unspecified: Secondary | ICD-10-CM | POA: Diagnosis not present

## 2021-01-02 DIAGNOSIS — I10 Essential (primary) hypertension: Secondary | ICD-10-CM | POA: Diagnosis not present

## 2021-01-20 DIAGNOSIS — E039 Hypothyroidism, unspecified: Secondary | ICD-10-CM | POA: Diagnosis not present

## 2021-01-20 DIAGNOSIS — Z0001 Encounter for general adult medical examination with abnormal findings: Secondary | ICD-10-CM | POA: Diagnosis not present

## 2021-01-20 DIAGNOSIS — I10 Essential (primary) hypertension: Secondary | ICD-10-CM | POA: Diagnosis not present

## 2021-01-20 DIAGNOSIS — G309 Alzheimer's disease, unspecified: Secondary | ICD-10-CM | POA: Diagnosis not present

## 2021-01-20 DIAGNOSIS — Z1389 Encounter for screening for other disorder: Secondary | ICD-10-CM | POA: Diagnosis not present

## 2021-02-02 ENCOUNTER — Other Ambulatory Visit: Payer: Self-pay | Admitting: *Deleted

## 2021-02-02 NOTE — Patient Outreach (Signed)
[place]) Care Management  02/02/2021  JIOVANNI HEETER 10-Mar-1917 [id number]   Telephone outreach and case closure.  These goals have been met:  Goals Addressed            This Visit's Progress   . COMPLETED: Care for the Caregiver       Follow Up Date 10/21/20  People who care for a person with dementia often feel stress.  Too much stress can be harmful to both of you.  You may feel depressed, exhausted, have trouble sleeping or have health problems.  You need to take care of yourself and reach out for help when you need it.  Notes: Need to discuss who else is helping with pt care and relieving Mrs. Totman of some of the caregiving responsibilities. She is 15 herself. Consider the [facility] center. CCS 09/23/20 Unable to address today, caregiver was away from the home. CCS 09/30/20 topic not discussed will talk about this next week. 10/14/20 Caregiver/wife voices frustration and difficulty at times managing pt behaviors. Best tactic is to leave him alone and approach later. Suggested distraction to focus attention on something else and then come back to task at hand. 02/02/21 Mrs. Seeney reports her husband is doing well although he is uncooperative at times but pleasant. She has learned to not push him to do things on her time but to let him do things when he is ready. It is difficult to get him to bath at times. SHe says his skin is in good condition. He is eating very we. For herself she went to an MD appt today. She has started crocheting again and this is a relaxing and calming activity that she enjoys. Their dauthter has moved back to the area and is cooking breakfast and dinner for them now. Having an extra hand has been very helpful.    . COMPLETED: Planning for Long-Term Care       Follow Up Date 02/08/21  Preparing for the future is one of the most important things to do.  Notes: Pt has had 2 ED visits and 2 hospitalizations since July. He has also had 2 SNF stays.  Palliative care is a service that would be beneficial at this stage in his life. Will discuss next call with wife. CCS 09/23/20 unable to discuss with wife today, she was not present. CCS 09/30/20 Spoke to wife today. Family interested in keeping pt home. Will discuss palliative care next week. 02/02/21 No ED visits since October. No falls. Family plans to care for Mr. Alabi until the end of life at this time. He is not really palliative care ready at this time. Mrs. Rabine has been counseled on possible need for LTC as pt condition may become too burdensome to care for him safely.     . COMPLETED: Prevent Falls       Follow Up Date  10/21/20  Notes: Pt has hx falls, hip fx, recent SNF stay, ED visit for dehydration, 87 years old. Pt is getting home health PT. Spoke to wife about home safety today and preventing falls. CCS 09/23/20 No falls reported. 09/30/20 no falls or injuries this week. 10/27 No falls. 10/14/20 No falls or injury.  02/02/21 No falls. Family is very vigilent in their supervision of Mr. Durbin. Reinforced that personal safety and preventing falls and other household injuries is key to avoiding trips to the ED.      Advised Mrs. Towers of case closure and to keep NP number  for future reference if a need arises.  [name]. [name], MSN, [facility] Gerontological Nurse Practitioner [facility] Care Management [phone]

## 2021-02-15 DIAGNOSIS — I10 Essential (primary) hypertension: Secondary | ICD-10-CM | POA: Diagnosis not present

## 2021-02-15 DIAGNOSIS — Z79899 Other long term (current) drug therapy: Secondary | ICD-10-CM | POA: Diagnosis not present

## 2021-02-15 DIAGNOSIS — E039 Hypothyroidism, unspecified: Secondary | ICD-10-CM | POA: Diagnosis not present

## 2021-02-15 DIAGNOSIS — Z0001 Encounter for general adult medical examination with abnormal findings: Secondary | ICD-10-CM | POA: Diagnosis not present

## 2021-02-17 DIAGNOSIS — I10 Essential (primary) hypertension: Secondary | ICD-10-CM | POA: Diagnosis not present

## 2021-02-17 DIAGNOSIS — M1990 Unspecified osteoarthritis, unspecified site: Secondary | ICD-10-CM | POA: Diagnosis not present

## 2021-03-20 DIAGNOSIS — E039 Hypothyroidism, unspecified: Secondary | ICD-10-CM | POA: Diagnosis not present

## 2021-03-20 DIAGNOSIS — I10 Essential (primary) hypertension: Secondary | ICD-10-CM | POA: Diagnosis not present

## 2021-04-19 DIAGNOSIS — I10 Essential (primary) hypertension: Secondary | ICD-10-CM | POA: Diagnosis not present

## 2021-04-19 DIAGNOSIS — M1990 Unspecified osteoarthritis, unspecified site: Secondary | ICD-10-CM | POA: Diagnosis not present

## 2021-05-20 DIAGNOSIS — E039 Hypothyroidism, unspecified: Secondary | ICD-10-CM | POA: Diagnosis not present

## 2021-05-20 DIAGNOSIS — I10 Essential (primary) hypertension: Secondary | ICD-10-CM | POA: Diagnosis not present

## 2021-06-19 DIAGNOSIS — E039 Hypothyroidism, unspecified: Secondary | ICD-10-CM | POA: Diagnosis not present

## 2021-06-19 DIAGNOSIS — I10 Essential (primary) hypertension: Secondary | ICD-10-CM | POA: Diagnosis not present

## 2021-07-27 ENCOUNTER — Other Ambulatory Visit: Payer: Self-pay

## 2021-07-28 ENCOUNTER — Other Ambulatory Visit: Payer: Self-pay | Admitting: *Deleted

## 2021-07-28 NOTE — Patient Outreach (Signed)
[facility]) Care Management  07/28/2021  Walter Goodwin 1917/11/12 [id number]  New [employer] referral. Telephone outreach.  Talked with [name] today. She gave her permission for pt's intake. He is not a good historian due to dementia. This NP was involved with him within the last year from 09/16/20-02/02/21.  [name] is 87 years old his chronic illnesses include HTN, CKD IIIb, Dementia, hypothyroidism, chronic constipation, anemia, hyperlipidemia, depression, bilateral LE edema and hs of R hip fx in 2018.  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 [name] is his primary caregiver. He has aid and assistance from the [employer] and family members help out too.  [name] agreed to NP making a home visit next Tuesday to complete his initial intake.  [name]. [name], MSN, [facility] Gerontological Nurse Practitioner [facility] Care Management [phone]

## 2021-08-02 DIAGNOSIS — M1990 Unspecified osteoarthritis, unspecified site: Secondary | ICD-10-CM | POA: Diagnosis not present

## 2021-08-02 DIAGNOSIS — R54 Age-related physical debility: Secondary | ICD-10-CM | POA: Diagnosis not present

## 2021-08-02 DIAGNOSIS — I10 Essential (primary) hypertension: Secondary | ICD-10-CM | POA: Diagnosis not present

## 2021-08-02 DIAGNOSIS — E039 Hypothyroidism, unspecified: Secondary | ICD-10-CM | POA: Diagnosis not present

## 2021-08-03 ENCOUNTER — Other Ambulatory Visit: Payer: Self-pay | Admitting: *Deleted

## 2021-08-04 ENCOUNTER — Encounter: Payer: Self-pay | Admitting: *Deleted
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
## 2021-09-02 DIAGNOSIS — M1990 Unspecified osteoarthritis, unspecified site: Secondary | ICD-10-CM | POA: Diagnosis not present

## 2021-09-02 DIAGNOSIS — I10 Essential (primary) hypertension: Secondary | ICD-10-CM | POA: Diagnosis not present

## 2021-09-07 ENCOUNTER — Other Ambulatory Visit: Payer: Self-pay | Admitting: *Deleted
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
## 2021-10-02 DIAGNOSIS — I10 Essential (primary) hypertension: Secondary | ICD-10-CM | POA: Diagnosis not present

## 2021-10-02 DIAGNOSIS — M1990 Unspecified osteoarthritis, unspecified site: Secondary | ICD-10-CM | POA: Diagnosis not present

## 2021-10-07 ENCOUNTER — Other Ambulatory Visit: Payer: Self-pay | Admitting: *Deleted

## 2021-10-07 NOTE — Patient Outreach (Signed)
[facility]) Care Management  10/07/2021  XAINE SANSOM 11/22/1917 [id number]  Telephone outreach to follow up on pt general health and well-being.  Talked with son, [name].   Goals Addressed               This Visit's Progress     Patient Stated     COMPLETED: Prevent falls by using walker when ambulating at all times as reported by family on monthly telephone assessments over the next 3 months. (pt-stated)   On track     Start date 08/04/21 Priority High Long term goal Expected completion date 02/08/22 but this goal will be life long. Follow up date: 10/10/21  Notes: 09/07/21 Pt uses his walker at all times. Family ensures safety when pt voices a need to get up and go to the bathroom or other place in the home. Praised for safe practices and vigilance. 08/04/21 No recent falls. Uses a walker faithfully and takes his time. Supervision by wife and family to ensure safety. Reinforced practices.      Other     Personal Safety Maintained as evidenced by no household accidents over the next 3 months.   On track     Start date: 08/04/21 HIGH PRIORITY LONG TERM GOAL Expected end date 11/10/21 but would expect this goal to be renewed ongoing until end of life. Follow up date 10/10/21 Barriers: None  Notes: 10/07/21 Talked to pt's son, [name]. He reports his father has good and bad days as far as his general well-being. He has NOT had any injury or fall. Encouraged and praised family's vigilance to protect his safety. 09/07/21 Talked to pt's son who reports Mr. Taym is doing very well. He has just assisted pt to the bath room. Son reports no falls or household injuries. Praise given to son and family for all the care and supervision they give Mr. Keeter. Keep up the excellent care they provide! 08/04/21 Family has been caring for Mr. And Mrs. Schwall for many years (Pt is 13 and wife is 95). They are very vigilent and have safety measures in place in case of  emergencies.      Will maintain current within 10# over the next 3 months as evidenced by measurement monthly.   On track     Start date: 08/04/21 High Priority Long term goal Expected end date 11/10/21 Follow up date: 11/10/21 Barriers: Health Behaviors Psychological Impairment   Notes: 10/07/21 Son reports that they have not weighed Mr. Oldaker. He reports he continues to eat very well and his clothing is still fitting like usual. 09/07/21 Son reports his father has a good appetite every morning and eats well. This is his best meal of the day. Son is unsure what his current wt is at present but says he doesn't think his dad has lost any wt. Encouraged offering fluids and snacks between meals and make sure he gets his supplement daily. 08/04/21 Pt family reports loss of appetite. Per EMR does not appear to have lost wt in 3 months (current wt per report is 210#). No special diet has been advised to eat whatever he wants. Drinks one or two ensure a day and more if he does not eat a meal. Usually only eats breakfast and supper and snacks during the day. Due to his advanced age and dementia would expect at some time the beginning of a wt loss trend.       We agreed on continued care plan and to  talk again in a month.  [name]. [name], MSN, [facility] Gerontological Nurse Practitioner [facility] Care Management [phone]

## 2021-10-08 DIAGNOSIS — Z23 Encounter for immunization: Secondary | ICD-10-CM | POA: Diagnosis not present

## 2021-11-02 DIAGNOSIS — I10 Essential (primary) hypertension: Secondary | ICD-10-CM | POA: Diagnosis not present

## 2021-11-02 DIAGNOSIS — M1990 Unspecified osteoarthritis, unspecified site: Secondary | ICD-10-CM | POA: Diagnosis not present

## 2021-11-09 ENCOUNTER — Other Ambulatory Visit: Payer: Medicare Other | Admitting: *Deleted

## 2021-11-11 ENCOUNTER — Other Ambulatory Visit: Payer: Self-pay | Admitting: *Deleted

## 2021-11-11 NOTE — Patient Instructions (Addendum)
Visit Information  Thank you for taking time to visit with me today. Please don't hesitate to contact me if I can be of assistance to you before our next scheduled telephone appointment.  Following are the goals we discussed today:  (Copy and paste patient goals from clinical care plan here)  Our next appointment is by telephone  on March 1st in the a.m..  Following is a copy of your care plan:   Patient Goals/Self-Care Activities: Pt will avoid injury over the next year as evidenced by family report each quarter. Pt will use walker at all times when ambulating. Family to supervise and ensure pt uses walker and give additional assist when needed to avoid falls.  Patient Goals/Self-Care Activities: Pt will maintain wt >180 over the next year as evidenced by verbal report of family regarding his appetite and the way his clothes are fitting. Call provider office for new concerns or questions  regarding decline in appetite and general well-being. Encourage 1-2 cans of ensure/day if pt is refusing a meal.  The patient verbalized understanding of instructions, educational materials, and care plan provided today and agreed to receive a mailed copy of patient instructions, educational materials, and care plan.  Mrs. Patlan was the contact who agreed to follow the care plan.  Telephone follow up appointment with care management team member scheduled for: 02/09/22  Kayleen Memos C. [name], MSN, [facility] Gerontological Nurse Practitioner [facility] Care Management [phone]

## 2021-11-11 NOTE — Patient Outreach (Signed)
[facility]) Care Management Geriatric Nurse Practitioner Note   11/11/2021 Name:  Walter Goodwin MRN:  [id number] DOB:  [birth year]  Summary: Pt is stable. Pt slide to the floor attempting to get back in his lift chair after being weighed.  Recommendations/Changes made from today's visit: Do not attempt to weigh pt anymore. We will judge his weight by appetite, how much he eats and by the way his clothes are fitting.  Subjective: Walter Goodwin is an 87 y.o. year old male who is a primary patient of Rosita Fire, MD. The care management team was consulted for assistance with care management and/or care coordination needs.    Geriatric Nurse Practitioner completed Telephone Visit today.   Objective: Weight 205 but probably not accurate as he was being held up by two family members and was unsteady of the scale.  Medications Reviewed Today     Reviewed by Deloria Lair, NP (Nurse Practitioner) on 11/11/21 at 1620  Med List Status: <None>   Medication Order Taking? Sig Documenting Provider Last Dose Status Informant  acetaminophen (TYLENOL) 325 MG tablet [id number] No Take 650 mg by mouth every 6 (six) hours as needed for mild pain. [provider] Taking Active Nursing Home Medication Administration Guide (MAG)  amLODipine (NORVASC) 10 MG tablet [id number] No Take 1 tablet (10 mg total) by mouth daily. Gerlene Fee, NP Taking Active Nursing Home Medication Administration Guide (MAG)  atorvastatin (LIPITOR) 10 MG tablet [id number] No Take 1 tablet (10 mg total) by mouth at bedtime. Gerlene Fee, NP Taking Active Nursing Home Medication Administration Guide (MAG)  docusate sodium (COLACE) 100 MG capsule [id number] No Take 100 mg by mouth 2 (two) times daily. [provider] Taking Active Nursing Home Medication Administration Guide (MAG)  donepezil (ARICEPT) 10 MG tablet [id number] No Take 1 tablet (10 mg total) by mouth at bedtime. Gerlene Fee,  NP Taking Active Nursing Home Medication Administration Guide (MAG)  ferrous sulfate 325 (65 FE) MG tablet [id number] No Take 1 tablet (325 mg total) by mouth daily before breakfast. Gerlene Fee, NP Taking Active Child  furosemide (LASIX) 20 MG tablet [id number] No Take 1 tablet (20 mg total) by mouth every other day. Gerlene Fee, NP Taking Active Nursing Home Medication Administration Guide (MAG)  levothyroxine (SYNTHROID) 175 MCG tablet [id number] No Take 1 tablet (175 mcg total) by mouth daily before breakfast. Gerlene Fee, NP Taking Active Child  mirtazapine (REMERON) 15 MG tablet [id number] No Take 1 tablet (15 mg total) by mouth at bedtime. Gerlene Fee, NP Taking Active Nursing Home Medication Administration Guide (MAG)  Baruch Gouty [id number] No Diet: Mechanical soft [provider] Taking Active Nursing Home Medication Administration Guide (MAG)  potassium chloride SA (KLOR-CON) 20 MEQ tablet [id number] No Take 1 tablet (20 mEq total) by mouth every other day. Gerlene Fee, NP Taking Active Nursing Home Medication Administration Guide (MAG)  sertraline (ZOLOFT) 100 MG tablet [id number] No Take 1 tablet (100 mg total) by mouth daily. Gerlene Fee, NP Taking Active Nursing Home Medication Administration Guide (MAG)  traZODone (DESYREL) 50 MG tablet [id number] No Take 25 mg by mouth 2 (two) times daily as needed (restlessness, agitation, or inability to relax). [provider] Taking Active Nursing Home Medication Administration Guide (MAG)  vitamin B-12 (CYANOCOBALAMIN) 1000 MCG tablet [id number] No Take 1,000 mcg by mouth daily. [provider] Taking Active Nursing Home Medication Administration Guide (MAG)  Care Plan  Review of patient past medical history, allergies, medications, health status, including review of consultants reports, laboratory and other test data, was performed as part of comprehensive evaluation for care  management services.   Care Plan : RN Care Manager Plan of Care  Updates made by Deloria Lair, NP since 11/11/2021 12:00 AM     Problem: Caregiver Stress Deleted 11/11/2021  Priority: Medium  Onset Date: 08/04/2021     Long-Range Goal: Caregiver Coping Optimized Completed 11/11/2021  Start Date: 08/04/2021  Expected End Date: 02/08/2022  Priority: High  Note:    Evidence-based guidance:  Recognize and validate the complex nature of dementia, as well as the impact on the caregiver and extended family; provide education and support to align with needs and stage of dementia.  Acknowledge feelings of grief associated with dementia such as loss of relationship, recreational activities, future with patient and loss associated with out-of-home placement.  Encourage verbalization of feelings without ruminating.  Discuss services that may help balance the caregiving role and living the life he/she chooses such as professionally or peer-led support groups, web-based support, counseling and services such as respite care or in-home help.  Refer for or provide psychoeducation activities such as cognitive reframing to improve family/caregiver quality of life, wellbeing, confidence, perception of burden, mental health and self-efficacy.  Encourage use of individualized coping strategies that may include yoga, spiritual support, distraction, exercise, relaxation, music, massage, music therapy and outdoor activities to utilize nature's restorative properties.  Suggest home-monitoring system that may reduce family/caregiver worry and improve sleep.  Provide proactive acknowledgement of potential for depressive symptoms to appear; normalize response to burden of caregiving; refer to primary care provider or for mental health services.   Notes: 08/04/21 Opened pt case. Pt and wife require a lot of over sight from daughter, Remo Lipps. Coping OK at this time and family does not wish parents to be institutionalized as  long as they can provide care at home.    Problem: High Risk for falls and household injuries due to frailty and gait abnormality (uses a walker).   Priority: High  Onset Date: 11/11/2021     Long-Range Goal: Pt will not sustain injury requiring ED visit over the next year.   Start Date: 11/11/2021  Expected End Date: 11/11/2022  Priority: High  Note:   Current Barriers:  Cognitive Deficits Family members level of understand of fall prevention  RNCM Clinical Goal(s):  Patient will  through collaboration with RN Care manager, provider, and care team.   Interventions: Inter-disciplinary care team collaboration (see longitudinal plan of care) Evaluation of current treatment plan related to  self management and patient's adherence to plan as established by provider  Patient Goals/Self-Care Activities: Pt will avoid injury over the next year as evidenced by family report each quarter. Pt will use walker at all times when ambulating . Family to supervise and ensure pt uses walker and give additional assist when needed to avoid falls.       Problem: Poor appetite   Priority: Medium  Onset Date: 11/11/2021     Long-Range Goal: Patient weight will not drop below 180# over the next year per chart review.   Start Date: 11/11/2021  Expected End Date: 11/11/2022  Priority: Medium  Note:   Current Barriers:  Cognitive Deficits   RNCM Clinical Goal(s):  Patient will  be monitored and wt judged by family report of appetite and the way his clothes are fitting (He is not able to weigh safely)  through collaboration with RN Care manager, provider, and care team.   Interventions: Inter-disciplinary care team collaboration (see longitudinal plan of care) Evaluation of current treatment plan related to  self management and patient's adherence to plan as established by provider  Patient Goals/Self-Care Activities: Pt will maintain wt >180 over the next year as evidenced by verbal report of  family regarding his appetite and the way his clothes are fitting. Call provider office for new concerns or questions  regarding decline in appetite and general well-being. Encourage 1-2 cans of ensure/day if pt is refusing a meal.        Plan: Telephone follow up appointment with care management team member scheduled for:  March   Aloma Boch C. [name], MSN, [facility] Gerontological Nurse Practitioner [facility] Care Management [phone]

## 2021-12-02 DIAGNOSIS — I10 Essential (primary) hypertension: Secondary | ICD-10-CM | POA: Diagnosis not present

## 2021-12-02 DIAGNOSIS — M1990 Unspecified osteoarthritis, unspecified site: Secondary | ICD-10-CM | POA: Diagnosis not present

## 2021-12-31 ENCOUNTER — Encounter (HOSPITAL_COMMUNITY): Payer: Self-pay

## 2021-12-31 ENCOUNTER — Emergency Department (HOSPITAL_COMMUNITY)
Admission: EM | Admit: 2021-12-31 | Discharge: 2021-12-31 | Disposition: A | Discharge status: Home / Self Care | Payer: No Typology Code available for payment source | Attending: Emergency Medicine | Admitting: Emergency Medicine

## 2021-12-31 ENCOUNTER — Emergency Department (HOSPITAL_COMMUNITY): Payer: No Typology Code available for payment source

## 2021-12-31 DIAGNOSIS — Z87891 Personal history of nicotine dependence: Secondary | ICD-10-CM | POA: Diagnosis not present

## 2021-12-31 DIAGNOSIS — R6889 Other general symptoms and signs: Secondary | ICD-10-CM | POA: Diagnosis not present

## 2021-12-31 DIAGNOSIS — Z79899 Other long term (current) drug therapy: Secondary | ICD-10-CM | POA: Insufficient documentation

## 2021-12-31 DIAGNOSIS — R4189 Other symptoms and signs involving cognitive functions and awareness: Secondary | ICD-10-CM

## 2021-12-31 DIAGNOSIS — R404 Transient alteration of awareness: Secondary | ICD-10-CM | POA: Diagnosis not present

## 2021-12-31 DIAGNOSIS — Z8546 Personal history of malignant neoplasm of prostate: Secondary | ICD-10-CM | POA: Insufficient documentation

## 2021-12-31 DIAGNOSIS — I10 Essential (primary) hypertension: Secondary | ICD-10-CM | POA: Diagnosis not present

## 2021-12-31 DIAGNOSIS — R410 Disorientation, unspecified: Secondary | ICD-10-CM | POA: Diagnosis not present

## 2021-12-31 DIAGNOSIS — R464 Slowness and poor responsiveness: Secondary | ICD-10-CM | POA: Insufficient documentation

## 2021-12-31 DIAGNOSIS — R4020 Unspecified coma: Secondary | ICD-10-CM | POA: Diagnosis not present

## 2021-12-31 DIAGNOSIS — Z743 Need for continuous supervision: Secondary | ICD-10-CM | POA: Diagnosis not present

## 2021-12-31 DIAGNOSIS — R531 Weakness: Secondary | ICD-10-CM | POA: Diagnosis present

## 2021-12-31 LAB — CBC WITH DIFFERENTIAL/PLATELET
Abs Immature Granulocytes: 0.03 10*3/uL (ref 0.00–0.07)
Basophils Absolute: 0.1 10*3/uL (ref 0.0–0.1)
Basophils Relative: 1 %
Eosinophils Absolute: 0.6 10*3/uL — ABNORMAL HIGH (ref 0.0–0.5)
Eosinophils Relative: 8 %
HCT: 40.7 % (ref 39.0–52.0)
Hemoglobin: 12.8 g/dL — ABNORMAL LOW (ref 13.0–17.0)
Immature Granulocytes: 0 %
Lymphocytes Relative: 30 %
Lymphs Abs: 2.3 10*3/uL (ref 0.7–4.0)
MCH: 30.2 pg (ref 26.0–34.0)
MCHC: 31.4 g/dL (ref 30.0–36.0)
MCV: 96 fL (ref 80.0–100.0)
Monocytes Absolute: 0.7 10*3/uL (ref 0.1–1.0)
Monocytes Relative: 10 %
Neutro Abs: 3.8 10*3/uL (ref 1.7–7.7)
Neutrophils Relative %: 51 %
Platelets: 263 10*3/uL (ref 150–400)
RBC: 4.24 MIL/uL (ref 4.22–5.81)
RDW: 15 % (ref 11.5–15.5)
WBC: 7.5 10*3/uL (ref 4.0–10.5)
nRBC: 0 % (ref 0.0–0.2)

## 2021-12-31 LAB — COMPREHENSIVE METABOLIC PANEL
ALT: 15 U/L (ref 0–44)
AST: 20 U/L (ref 15–41)
Albumin: 3.8 g/dL (ref 3.5–5.0)
Alkaline Phosphatase: 75 U/L (ref 38–126)
Anion gap: 7 (ref 5–15)
BUN: 15 mg/dL (ref 8–23)
CO2: 25 mmol/L (ref 22–32)
Calcium: 9.9 mg/dL (ref 8.9–10.3)
Chloride: 108 mmol/L (ref 98–111)
Creatinine, Ser: 1.17 mg/dL (ref 0.61–1.24)
GFR, Estimated: 55 mL/min — ABNORMAL LOW (ref >60)
Glucose, Bld: 93 mg/dL (ref 70–99)
Potassium: 4.6 mmol/L (ref 3.5–5.1)
Sodium: 140 mmol/L (ref 135–145)
Total Bilirubin: 0.5 mg/dL (ref 0.3–1.2)
Total Protein: 7.6 g/dL (ref 6.5–8.1)

## 2021-12-31 NOTE — ED Provider Notes (Signed)
[facility] EMERGENCY DEPARTMENT Provider Note   CSN: [id number] Arrival date & time: 12/31/21  8299     History  Chief Complaint  Patient presents with   Weakness    Walter Goodwin is a 88 y.o. male.  Patient brought in by EMS.  Patient lives at home with family.  Son went to wake him this morning.  And he would not wake up.  Did not wake up until EMS got there.  Patient's blood sugar was 135.  Patient was fine yesterday.  In no apparent illness.  Patient without any complaints currently.  Patient walks with a walker.  Past medical history sniffing for dementia hypertension high cholesterol prostate cancer 1995.  And gallbladder removal 8.  Patient prior smoker.  Quit in 2008.        Home Medications Prior to Admission medications   Medication Sig Start Date End Date Taking? Authorizing Provider  acetaminophen (TYLENOL) 325 MG tablet Take 650 mg by mouth every 6 (six) hours as needed for mild pain.   Yes [provider]  amLODipine (NORVASC) 10 MG tablet Take 1 tablet (10 mg total) by mouth daily. Patient taking differently: Take 5 mg by mouth daily. 09/11/20  Yes Gerlene Fee, NP  atorvastatin (LIPITOR) 10 MG tablet Take 1 tablet (10 mg total) by mouth at bedtime. 09/11/20  Yes Gerlene Fee, NP  docusate sodium (COLACE) 100 MG capsule Take 100 mg by mouth 2 (two) times daily.   Yes [provider]  donepezil (ARICEPT) 10 MG tablet Take 1 tablet (10 mg total) by mouth at bedtime. 09/11/20  Yes Gerlene Fee, NP  levothyroxine (SYNTHROID) 175 MCG tablet Take 1 tablet (175 mcg total) by mouth daily before breakfast. 09/11/20  Yes Gerlene Fee, NP  mirtazapine (REMERON) 15 MG tablet Take 1 tablet (15 mg total) by mouth at bedtime. 09/11/20  Yes Gerlene Fee, NP  mirtazapine (REMERON) 15 MG tablet Take 1 tablet by mouth at bedtime. 03/25/21  Yes [provider]  sertraline (ZOLOFT) 100 MG tablet Take 1 tablet (100 mg total) by mouth daily.  09/11/20  Yes Gerlene Fee, NP  traZODone (DESYREL) 50 MG tablet Take 25 mg by mouth 2 (two) times daily as needed (restlessness, agitation, or inability to relax).   Yes [provider]  vitamin B-12 (CYANOCOBALAMIN) 1000 MCG tablet Take 1,000 mcg by mouth daily.   Yes [provider]  ferrous sulfate 325 (65 FE) MG tablet Take 1 tablet (325 mg total) by mouth daily before breakfast. Patient not taking: Reported on 12/31/2021 09/11/20   Gerlene Fee, NP  furosemide (LASIX) 20 MG tablet Take 1 tablet (20 mg total) by mouth every other day. Patient not taking: Reported on 12/31/2021 09/11/20   Gerlene Fee, NP  potassium chloride SA (KLOR-CON) 20 MEQ tablet Take 1 tablet (20 mEq total) by mouth every other day. Patient not taking: Reported on 12/31/2021 09/11/20   Gerlene Fee, NP      Allergies    Sulfa antibiotics    Review of Systems   Review of Systems  Unable to perform ROS: Dementia   Physical Exam Updated Vital Signs BP (!) 161/66    Pulse (!) 52    Temp 98.2 F (36.8 C) (Oral)    Resp (!) 24    Ht 1.88 m (6\' 2" )    Wt 96 kg    SpO2 96%    BMI 27.17 kg/m  Physical  Exam Vitals and nursing note reviewed.  Constitutional:      General: He is not in acute distress.    Appearance: Normal appearance. He is well-developed. He is not ill-appearing.  HENT:     Head: Normocephalic and atraumatic.  Eyes:     Extraocular Movements: Extraocular movements intact.     Conjunctiva/sclera: Conjunctivae normal.     Pupils: Pupils are equal, round, and reactive to light.  Cardiovascular:     Rate and Rhythm: Normal rate and regular rhythm.     Heart sounds: No murmur heard. Pulmonary:     Effort: Pulmonary effort is normal. No respiratory distress.     Breath sounds: Normal breath sounds.  Abdominal:     Palpations: Abdomen is soft.     Tenderness: There is no abdominal tenderness.  Musculoskeletal:        General: No swelling.     Cervical back: Normal  range of motion and neck supple.     Right lower leg: Edema present.     Left lower leg: Edema present.  Skin:    General: Skin is warm and dry.     Capillary Refill: Capillary refill takes less than 2 seconds.  Neurological:     General: No focal deficit present.     Mental Status: He is alert. Mental status is at baseline.  Psychiatric:        Mood and Affect: Mood normal.    ED Results / Procedures / Treatments   Labs (all labs ordered are listed, but only abnormal results are displayed) Labs Reviewed  COMPREHENSIVE METABOLIC PANEL - Abnormal; Notable for the following components:      Result Value   GFR, Estimated 55 (*)    All other components within normal limits  CBC WITH DIFFERENTIAL/PLATELET - Abnormal; Notable for the following components:   Hemoglobin 12.8 (*)    Eosinophils Absolute 0.6 (*)    All other components within normal limits    EKG EKG Interpretation  Date/Time:  Friday December 31 2021 08:17:28 EST Ventricular Rate:  52 PR Interval:  200 QRS Duration: 137 QT Interval:  476 QTC Calculation: 443 R Axis:   -54 Text Interpretation: Sinus rhythm RBBB and LAFB No significant change since last tracing Confirmed by Fredia Sorrow [phone]) on 12/31/2021 10:07:45 AM  Radiology DG Chest Port 1 View  Result Date: 12/31/2021 CLINICAL DATA:  Weakness.  Ex-smoker.  Prostate cancer. EXAM: PORTABLE CHEST 1 VIEW COMPARISON:  09/14/2020 FINDINGS: Numerous leads and wires project over the chest. Midline trachea. Moderate cardiomegaly. No pleural effusion or pneumothorax. Lower lung predominant interstitial thickening is likely related to the history of remote smoking. No lobar consolidation. IMPRESSION: Cardiomegaly without congestive failure. Electronically Signed   By: Abigail Miyamoto M.D.   On: 12/31/2021 10:02    Procedures Procedures  Patient's cardiac monitoring without any arrhythmias  Medications Ordered in ED Medications - No data to display  ED Course/  Medical Decision Making/ A&P                           Medical Decision Making Amount and/or Complexity of Data Reviewed Labs: ordered. Radiology: ordered.   Patient now back to baseline.  Patient's son states that if the had not been difficult to wake up.  They would have had no concerns based on how he is now.  How he was prior.  Chest x-ray showed no evidence of any significant abnormalities did show cardiomegaly  but no congestive heart failure.  Patient without any chest pain.  Not complaining of any shortness of breath.  Complete metabolic panel normal except for GFR 55.  Which is not significantly changed.  Also a mild anemia on the CBC with a hemoglobin of 12.8.  No leukocytosis.  Patient stable for discharge back home.  Patient without any evidence of any strokelike disorder.  No evidence of any arrhythmias.  Suspect that what to happen sometimes at this age.  The part of the brain that is to wait folks up just does not function as expected.  And patient is back to baseline currently.     Final Clinical Impression(s) / ED Diagnoses Final diagnoses:  Unresponsiveness    Rx / DC Orders ED Discharge Orders     None         Fredia Sorrow, MD 12/31/21 1052

## 2021-12-31 NOTE — Discharge Instructions (Addendum)
Return for any new or worse symptoms.  Work-up here today without any significant abnormalities.  Follow-up with primary care doctor as needed.

## 2021-12-31 NOTE — ED Triage Notes (Signed)
Pt unresponsive per but after stimulation pt alert and oriented, skin warm and dry, pt has no complaints BS 135.

## 2021-12-31 NOTE — ED Notes (Signed)
Pt cleansed and changed IV removed awaiting transport home.

## 2022-01-02 DIAGNOSIS — M1990 Unspecified osteoarthritis, unspecified site: Secondary | ICD-10-CM | POA: Diagnosis not present

## 2022-01-02 DIAGNOSIS — I10 Essential (primary) hypertension: Secondary | ICD-10-CM | POA: Diagnosis not present

## 2022-02-02 DIAGNOSIS — I10 Essential (primary) hypertension: Secondary | ICD-10-CM | POA: Diagnosis not present

## 2022-02-02 DIAGNOSIS — M1990 Unspecified osteoarthritis, unspecified site: Secondary | ICD-10-CM | POA: Diagnosis not present

## 2022-02-09 ENCOUNTER — Other Ambulatory Visit: Payer: Self-pay | Admitting: *Deleted

## 2022-02-09 NOTE — Patient Outreach (Signed)
[facility]) Care Management ?Geriatric Nurse Practitioner Note ? ? ?02/09/2022 ?Name:  Walter Goodwin MRN:  [id number] DOB:  Dec 31, 1916 ? ?Summary: ?Pt is stable. Had 1 ED visit for unresponsiveness in am which resolved quickly and did not require any intervention. ? ?Recommendations/Changes made from today's visit: ?Continue current family care for Mr. Walter Goodwin. Suggest Palliative Care Services ? ?Subjective: ?Walter Goodwin is an 88 y.o. year old male who is a primary patient of Fanta, Normajean Baxter, MD. The care management team was consulted for assistance with care management and/or care coordination needs.   ? ?Geriatric Nurse Practitioner completed Telephone Visit today.  ? ?Objective: Current wt 211# taken in ED ? ?Patient Active Problem List  ? Diagnosis Date Noted  ? Chronic kidney disease, stage 3b ([place]) 08/31/2020  ? Major depression, recurrent, chronic ([place]) 08/31/2020  ? Chronic constipation 08/31/2020  ? Hypothyroidism 08/31/2020  ? Bilateral lower extremity edema 08/31/2020  ? UTI (urinary tract infection) 08/24/2020  ? Dementia ([place]) 06/29/2017  ? Anemia 06/29/2017  ? Hyperlipidemia 06/29/2017  ? Essential hypertension 06/25/2017  ? Intertrochanteric fracture of right femur ([place]) 06/25/2017  ? Knee pain, right 06/25/2017  ? Syncope 06/25/2017  ? ?Outpatient Encounter Medications as of 02/09/2022  ?Medication Sig  ? acetaminophen (TYLENOL) 325 MG tablet Take 650 mg by mouth every 6 (six) hours as needed for mild pain.  ? amLODipine (NORVASC) 10 MG tablet Take 1 tablet (10 mg total) by mouth daily. (Patient taking differently: Take 5 mg by mouth daily.)  ? atorvastatin (LIPITOR) 10 MG tablet Take 1 tablet (10 mg total) by mouth at bedtime.  ? docusate sodium (COLACE) 100 MG capsule Take 100 mg by mouth 2 (two) times daily.  ? donepezil (ARICEPT) 10 MG tablet Take 1 tablet (10 mg total) by mouth at bedtime.  ? ferrous sulfate 325 (65 FE) MG tablet Take 1 tablet (325 mg total) by mouth  daily before breakfast. (Patient not taking: Reported on 12/31/2021)  ? furosemide (LASIX) 20 MG tablet Take 1 tablet (20 mg total) by mouth every other day. (Patient not taking: Reported on 12/31/2021)  ? levothyroxine (SYNTHROID) 175 MCG tablet Take 1 tablet (175 mcg total) by mouth daily before breakfast.  ? mirtazapine (REMERON) 15 MG tablet Take 1 tablet (15 mg total) by mouth at bedtime.  ? mirtazapine (REMERON) 15 MG tablet Take 1 tablet by mouth at bedtime.  ? potassium chloride SA (KLOR-CON) 20 MEQ tablet Take 1 tablet (20 mEq total) by mouth every other day. (Patient not taking: Reported on 12/31/2021)  ? sertraline (ZOLOFT) 100 MG tablet Take 1 tablet (100 mg total) by mouth daily.  ? traZODone (DESYREL) 50 MG tablet Take 25 mg by mouth 2 (two) times daily as needed (restlessness, agitation, or inability to relax).  ? vitamin B-12 (CYANOCOBALAMIN) 1000 MCG tablet Take 1,000 mcg by mouth daily.  ? ?No facility-administered encounter medications on file as of 02/09/2022.  ? ?SDOH:  (Social Determinants of Health) assessments and interventions performed:  ? ?Care Plan ? ?Review of patient past medical history, allergies, medications, health status, including review of consultants reports, laboratory and other test data, was performed as part of comprehensive evaluation for care management services.  ? ?Care Plan : RN Care Manager Plan of Care  ?Updates made by Deloria Lair, NP since 02/09/2022 12:00 AM  ?  ? ?Problem: High Risk for falls and household injuries due to frailty and gait abnormality (uses a walker).   ?Priority: High  ?  Onset Date: 11/11/2021  ?  ? ?Long-Range Goal: Pt will not sustain injury requiring ED visit over the next year.   ?Start Date: 11/11/2021  ?Expected End Date: 11/11/2022  ?Priority: High  ?Note:   ? ?Update: 02/09/22  (Status: Goal on Track (progressing): YES.) Long Term Goal  ?Evaluation of current status: No falls or household injuries. Family is very vigilent to ensure pt is safe,  using walker and accompanying him on trips to bathroom and to go to bed. He does not walk around without someone right there. ? ?Current Barriers:  ?Cognitive Deficits ?Family members level of understand of fall prevention ? ?RNCM Clinical Goal(s):  ?Patient will  through collaboration with RN Care manager, provider, and care team.  ? ?Interventions: ?Inter-disciplinary care team collaboration (see longitudinal plan of care) ?Evaluation of current treatment plan related to  self management and patient's adherence to plan as established by provider ? ?Patient Goals/Self-Care Activities: Pt will avoid injury over the next year as evidenced by family report each quarter. ?Pt will use walker at all times when ambulating . ?Family to supervise and ensure pt uses walker and give additional assist when needed to avoid falls. ? ?  ?  ? ?Problem: Poor appetite   ?Priority: Medium  ?Onset Date: 11/11/2021  ?  ? ?Long-Range Goal: Patient weight will not drop below 180# over the next year per chart review.   ?Start Date: 11/11/2021  ?Expected End Date: 11/11/2022  ?Priority: Medium  ?Note:   ? ?Update 02/09/22:  (Status: Goal on Track (progressing): YES.) Long Term Goal  ?Pt current weight 211# (He went to the ED in January due to difficulty awakening him and was weighed there). He is not safe to weigh at home. Wife reports his appetite remains pretty good and his clothes fit has not changed.  They offer meals, snacks, ensure/boost regularly. Encouraged them to continue their great care for Mr. Walter Goodwin. ? ?Current Barriers:  ?Cognitive Deficits ? ?RNCM Clinical Goal(s):  ?Patient will  be monitored and wt judged by family report of appetite and the way his clothes are fitting (He is not able to weigh safely)  through collaboration with RN Care manager, provider, and care team.  ? ?Interventions: ?Inter-disciplinary care team collaboration (see longitudinal plan of care) ?Evaluation of current treatment plan related to  self  management and patient's adherence to plan as established by provider ? ?Patient Goals/Self-Care Activities: Pt will maintain wt >180 over the next year as evidenced by verbal report of family regarding his appetite and the way his clothes are fitting. ?Call provider office for new concerns or questions  regarding decline in appetite and general well-being. ?Encourage 1-2 cans of ensure/day if pt is refusing a meal. ? ? ?  ?  ? ?Plan: Recommend PALLIATIVE CARE SERVICES. MRS. Achor, SON WAYNE AND DAUGHTER JOAN ARE ALL IN AGREEMENT. WILL REQUEST DR. FANTA MAKE THIS REFERRAL. ? Telephone follow up appointment with care management team member scheduled for:  3 months. Family agrees to continued current plan of care. ? ?[name]. Sevanna Ballengee, MSN, GNP-BC ?Gerontological Nurse Practitioner ?[facility] Care Management ?[phone] ? ? ? ? ? ?

## 2022-03-02 DIAGNOSIS — M1990 Unspecified osteoarthritis, unspecified site: Secondary | ICD-10-CM | POA: Diagnosis not present

## 2022-03-02 DIAGNOSIS — I10 Essential (primary) hypertension: Secondary | ICD-10-CM | POA: Diagnosis not present

## 2022-03-18 ENCOUNTER — Encounter (HOSPITAL_COMMUNITY): Payer: Self-pay | Admitting: *Deleted

## 2022-03-18 ENCOUNTER — Emergency Department (HOSPITAL_COMMUNITY): Payer: Medicare Other

## 2022-03-18 ENCOUNTER — Other Ambulatory Visit: Payer: Self-pay

## 2022-03-18 ENCOUNTER — Emergency Department (HOSPITAL_COMMUNITY)
Admission: EM | Admit: 2022-03-18 | Discharge: 2022-03-18 | Disposition: A | Discharge status: Home / Self Care | Payer: Medicare Other | Attending: Emergency Medicine | Admitting: Emergency Medicine

## 2022-03-18 DIAGNOSIS — R6889 Other general symptoms and signs: Secondary | ICD-10-CM | POA: Diagnosis not present

## 2022-03-18 DIAGNOSIS — F0390 Unspecified dementia without behavioral disturbance: Secondary | ICD-10-CM | POA: Diagnosis not present

## 2022-03-18 DIAGNOSIS — Z20822 Contact with and (suspected) exposure to covid-19: Secondary | ICD-10-CM | POA: Diagnosis not present

## 2022-03-18 DIAGNOSIS — Z79899 Other long term (current) drug therapy: Secondary | ICD-10-CM | POA: Insufficient documentation

## 2022-03-18 DIAGNOSIS — I517 Cardiomegaly: Secondary | ICD-10-CM | POA: Diagnosis not present

## 2022-03-18 DIAGNOSIS — Z743 Need for continuous supervision: Secondary | ICD-10-CM | POA: Diagnosis not present

## 2022-03-18 DIAGNOSIS — Z8546 Personal history of malignant neoplasm of prostate: Secondary | ICD-10-CM | POA: Diagnosis not present

## 2022-03-18 DIAGNOSIS — J069 Acute upper respiratory infection, unspecified: Secondary | ICD-10-CM

## 2022-03-18 DIAGNOSIS — R059 Cough, unspecified: Secondary | ICD-10-CM | POA: Diagnosis present

## 2022-03-18 DIAGNOSIS — R531 Weakness: Secondary | ICD-10-CM | POA: Diagnosis not present

## 2022-03-18 DIAGNOSIS — I10 Essential (primary) hypertension: Secondary | ICD-10-CM | POA: Insufficient documentation

## 2022-03-18 DIAGNOSIS — R062 Wheezing: Secondary | ICD-10-CM | POA: Insufficient documentation

## 2022-03-18 LAB — CBC WITH DIFFERENTIAL/PLATELET
Abs Immature Granulocytes: 0.05 10*3/uL (ref 0.00–0.07)
Basophils Absolute: 0.1 10*3/uL (ref 0.0–0.1)
Basophils Relative: 1 %
Eosinophils Absolute: 0.5 10*3/uL (ref 0.0–0.5)
Eosinophils Relative: 6 %
HCT: 38 % — ABNORMAL LOW (ref 39.0–52.0)
Hemoglobin: 11.7 g/dL — ABNORMAL LOW (ref 13.0–17.0)
Immature Granulocytes: 1 %
Lymphocytes Relative: 29 %
Lymphs Abs: 2.6 10*3/uL (ref 0.7–4.0)
MCH: 29.4 pg (ref 26.0–34.0)
MCHC: 30.8 g/dL (ref 30.0–36.0)
MCV: 95.5 fL (ref 80.0–100.0)
Monocytes Absolute: 0.9 10*3/uL (ref 0.1–1.0)
Monocytes Relative: 10 %
Neutro Abs: 4.8 10*3/uL (ref 1.7–7.7)
Neutrophils Relative %: 53 %
Platelets: 298 10*3/uL (ref 150–400)
RBC: 3.98 MIL/uL — ABNORMAL LOW (ref 4.22–5.81)
RDW: 14.9 % (ref 11.5–15.5)
WBC: 8.9 10*3/uL (ref 4.0–10.5)
nRBC: 0 % (ref 0.0–0.2)

## 2022-03-18 LAB — COMPREHENSIVE METABOLIC PANEL
ALT: 15 U/L (ref 0–44)
AST: 20 U/L (ref 15–41)
Albumin: 3.7 g/dL (ref 3.5–5.0)
Alkaline Phosphatase: 78 U/L (ref 38–126)
Anion gap: 8 (ref 5–15)
BUN: 20 mg/dL (ref 8–23)
CO2: 26 mmol/L (ref 22–32)
Calcium: 9.9 mg/dL (ref 8.9–10.3)
Chloride: 110 mmol/L (ref 98–111)
Creatinine, Ser: 1.28 mg/dL — ABNORMAL HIGH (ref 0.61–1.24)
GFR, Estimated: 49 mL/min — ABNORMAL LOW (ref >60)
Glucose, Bld: 93 mg/dL (ref 70–99)
Potassium: 4.4 mmol/L (ref 3.5–5.1)
Sodium: 144 mmol/L (ref 135–145)
Total Bilirubin: 0.2 mg/dL — ABNORMAL LOW (ref 0.3–1.2)
Total Protein: 7.7 g/dL (ref 6.5–8.1)

## 2022-03-18 LAB — RESP PANEL BY RT-PCR (FLU A&B, COVID) ARPGX2
Influenza A by PCR: NEGATIVE
Influenza B by PCR: NEGATIVE
SARS Coronavirus 2 by RT PCR: NEGATIVE

## 2022-03-18 LAB — BRAIN NATRIURETIC PEPTIDE: B Natriuretic Peptide: 64 pg/mL (ref 0.0–100.0)

## 2022-03-18 MED ORDER — IPRATROPIUM-ALBUTEROL 0.5-2.5 (3) MG/3ML IN SOLN
3.0000 mL | Freq: Once | RESPIRATORY_TRACT | Status: AC
  Administered 2022-03-18: 3 mL via RESPIRATORY_TRACT
  Filled 2022-03-18: qty 3

## 2022-03-18 MED ORDER — SODIUM CHLORIDE 0.9 % IV BOLUS (SEPSIS)
500.0000 mL | Freq: Once | INTRAVENOUS | Status: AC
  Administered 2022-03-18: 500 mL via INTRAVENOUS

## 2022-03-18 MED ORDER — GUAIFENESIN ER 1200 MG PO TB12: 1.0000 | ORAL_TABLET | Freq: Two times a day (BID) | ORAL | 0 refills | Status: AC

## 2022-03-18 NOTE — Discharge Instructions (Signed)
The test today did not show any signs of pneumonia.  Your COVID and flu test were negative.  Try taking the guaifenesin to help with your cough and congestion.  Follow-up with your doctor next week to be rechecked if the symptoms have not resolved.  Return to the ED for fevers worsening symptoms ?

## 2022-03-18 NOTE — ED Provider Notes (Addendum)
[facility] EMERGENCY DEPARTMENT Provider Note   CSN: [id number] Arrival date & time: 03/18/22  0941     History  Chief Complaint  Patient presents with   Weakness    Walter Goodwin is a 88 y.o. male.   Weakness Associated symptoms: cough   Associated symptoms: no fever   Patient has a history of dementia, hypertension, hypercholesterolemia, prostate cancer. Patient presents to the ED for evaluation of generalized weakness.  Family states the patient still lives at home.  Normally he is able to get from the bed and sit in a reclining chair with the assistance of family members.  He does not usually walk much at baseline.  However in the last couple of days he has not really been able to get out of bed.  He has had generalized weakness.  Patient has been coughing more the last few days.  No fevers.  No vomiting.  Patient is not having any headache.  He does not eat much but he really does enjoy Ensure drinks.  Home Medications Prior to Admission medications   Medication Sig Start Date End Date Taking? Authorizing Provider  acetaminophen (TYLENOL) 325 MG tablet Take 650 mg by mouth every 6 (six) hours as needed for mild pain.   Yes [provider]  amLODipine (NORVASC) 5 MG tablet Take 5 mg by mouth daily.   Yes [provider]  atorvastatin (LIPITOR) 20 MG tablet Take 10 mg by mouth at bedtime.   Yes [provider]  docusate sodium (COLACE) 100 MG capsule Take 100 mg by mouth 2 (two) times daily.   Yes [provider]  donepezil (ARICEPT) 10 MG tablet Take 1 tablet (10 mg total) by mouth at bedtime. 09/11/20  Yes Sharee Holster, NP  Ensure (ENSURE) Take 237 mLs by mouth in the morning and at bedtime.   Yes [provider]  Guaifenesin 1200 MG TB12 Take 1 tablet (1,200 mg total) by mouth 2 (two) times daily at 10 AM and 5 PM for 7 days. 03/18/22 03/25/22 Yes Linwood Dibbles, MD  levothyroxine (SYNTHROID) 175 MCG tablet Take 1 tablet (175 mcg  total) by mouth daily before breakfast. 09/11/20  Yes Sharee Holster, NP  mirtazapine (REMERON) 15 MG tablet Take 1 tablet (15 mg total) by mouth at bedtime. 09/11/20  Yes Sharee Holster, NP  potassium chloride SA (KLOR-CON) 20 MEQ tablet Take 1 tablet (20 mEq total) by mouth every other day. Patient taking differently: Take 20 mEq by mouth daily. 09/11/20  Yes Sharee Holster, NP  sertraline (ZOLOFT) 100 MG tablet Take 1 tablet (100 mg total) by mouth daily. Patient taking differently: Take 100 mg by mouth at bedtime. 09/11/20  Yes Sharee Holster, NP  traZODone (DESYREL) 50 MG tablet Take 75 mg by mouth 2 (two) times daily as needed (restlessness, agitation, or inability to relax).   Yes [provider]  vitamin B-12 (CYANOCOBALAMIN) 1000 MCG tablet Take 1,000 mcg by mouth daily.   Yes [provider]      Allergies    Sulfa antibiotics    Review of Systems   Review of Systems  Constitutional:  Negative for fever.  Respiratory:  Positive for cough.   Neurological:  Positive for weakness.   Physical Exam Updated Vital Signs BP (!) 171/68   Pulse (!) 52   Temp 97.6 F (36.4 C) (Oral)   Resp 20   Ht 1.829 m (6')   Wt 96 kg  SpO2 96%   BMI 28.70 kg/m  Physical Exam Vitals and nursing note reviewed.  Constitutional:      General: He is not in acute distress.    Appearance: He is well-developed.  HENT:     Head: Normocephalic and atraumatic.     Right Ear: External ear normal.     Left Ear: External ear normal.  Eyes:     General: No scleral icterus.       Right eye: No discharge.        Left eye: No discharge.     Conjunctiva/sclera: Conjunctivae normal.  Neck:     Trachea: No tracheal deviation.  Cardiovascular:     Rate and Rhythm: Normal rate and regular rhythm.  Pulmonary:     Effort: Pulmonary effort is normal. No respiratory distress.     Breath sounds: No stridor. Wheezing present. No rales.  Abdominal:     General: Bowel sounds are  normal. There is no distension.     Palpations: Abdomen is soft.     Tenderness: There is no abdominal tenderness. There is no guarding or rebound.  Musculoskeletal:        General: No tenderness or deformity.     Cervical back: Neck supple.  Skin:    General: Skin is warm and dry.     Findings: No rash.  Neurological:     General: No focal deficit present.     Mental Status: He is alert.     Cranial Nerves: No cranial nerve deficit (no facial droop, extraocular movements intact, no slurred speech).     Sensory: No sensory deficit.     Motor: No abnormal muscle tone or seizure activity.     Coordination: Coordination normal.     Comments: Patient able to lift both arms off the bed without difficulty, able to lift both legs off the bed without difficulty  Psychiatric:        Mood and Affect: Mood normal.    ED Results / Procedures / Treatments   Labs (all labs ordered are listed, but only abnormal results are displayed) Labs Reviewed  CBC WITH DIFFERENTIAL/PLATELET - Abnormal; Notable for the following components:      Result Value   RBC 3.98 (*)    Hemoglobin 11.7 (*)    HCT 38.0 (*)    All other components within normal limits  COMPREHENSIVE METABOLIC PANEL - Abnormal; Notable for the following components:   Creatinine, Ser 1.28 (*)    Total Bilirubin 0.2 (*)    GFR, Estimated 49 (*)    All other components within normal limits  RESP PANEL BY RT-PCR (FLU A&B, COVID) ARPGX2  BRAIN NATRIURETIC PEPTIDE    EKG None  Radiology DG Chest Port 1 View  Result Date: 03/18/2022 CLINICAL DATA:  Generalized weakness and loss of appetite over the last 2 months EXAM: PORTABLE CHEST 1 VIEW COMPARISON:  12/31/2021 FINDINGS: Borderline cardiomegaly. Aortic atherosclerosis and tortuosity. The lungs are clear. No infiltrate, collapse or effusion. No edema. Ordinary degenerative changes affect the spine. IMPRESSION: No active disease. Electronically Signed   By: Paulina Fusi M.D.   On:  03/18/2022 10:30    Procedures Procedures    Medications Ordered in ED Medications  sodium chloride 0.9 % bolus 500 mL (500 mLs Intravenous New Bag/Given 03/18/22 1221)  ipratropium-albuterol (DUONEB) 0.5-2.5 (3) MG/3ML nebulizer solution 3 mL (3 mLs Nebulization Given 03/18/22 1126)    ED Course/ Medical Decision Making/ A&P Clinical Course as of 03/18/22 1227  Fri Mar 18, 2022  1115 DG Chest [place] 1 View Chest x-ray images and radiology report reviewed.  No acute process [JK]  1209 Resp Panel by RT-PCR (Flu A&B, Covid) Nasopharyngeal Swab Covid flu negative [JK]  1209 CBC WITH DIFFERENTIAL(!) Hemoglobin similar to previous values [JK]  1210 Comprehensive metabolic panel(!) Creatinine increased from 1 month ago but decreased from 1 year ago [JK]    Clinical Course User Index [JK] Linwood Dibbles, MD                           Medical Decision Making Amount and/or Complexity of Data Reviewed Labs: ordered. Decision-making details documented in ED Course. Radiology: ordered. Decision-making details documented in ED Course.  Risk OTC drugs. Prescription drug management.   Patient presented to the ED for evaluation of cough and increasing weakness.  At baseline patient primarily sits in the chair.  He does not walk around much.  In the ED the patient is alert and awake follows commands.  Able to move all extremities without difficulty.  Was concerned about the possibility of severe dehydration and anemia or acute infection with his symptoms.  Patient's laboratory tests are reassuring.  He has a very slight increase in his creatinine but it is similar to previous values on file.  No evidence of pneumonia on x-ray.  COVID and flu are negative.  Patient is breathing easily in the ED does not require supplemental oxygen.  Patient is able to eat and drink.  I suspect his weakness is multifactorial partly related to his advanced age as well as mild upper respiratory infection.  Evaluation and  diagnostic testing in the emergency department does not suggest an emergent condition requiring admission or immediate intervention beyond what has been performed at this time.  The patient is safe for discharge and has been instructed to return immediately for worsening symptoms, change in symptoms or any other concerns.         Final Clinical Impression(s) / ED Diagnoses Final diagnoses:  Upper respiratory tract infection, unspecified type    Rx / DC Orders ED Discharge Orders          Ordered    Guaifenesin 1200 MG TB12  2 times daily        03/18/22 1226              Linwood Dibbles, MD 03/18/22 1228   Patient was able to stand and transfer to the wheelchair.  This is his baseline mobility.   Linwood Dibbles, MD 03/18/22 1348

## 2022-03-18 NOTE — ED Triage Notes (Signed)
Pt brought in by [facility] from home with c/o generalized weakness and decreased appetite x 2 months. Pt has a cough and EMS reports rhonchi in lung fields. Son at bedside reports the cough is at pt's baseline. CBG 123, BP 161/78, HR 55, O2 sat 97, axillary temp 97.6 for EMS.  ?

## 2022-03-18 NOTE — ED Notes (Signed)
Pt's son and wife called and informed of pt's discharge.   ?

## 2022-03-30 ENCOUNTER — Other Ambulatory Visit: Payer: Self-pay | Admitting: *Deleted

## 2022-03-30 ENCOUNTER — Encounter: Payer: Self-pay | Admitting: *Deleted

## 2022-03-30 DIAGNOSIS — C61 Malignant neoplasm of prostate: Secondary | ICD-10-CM | POA: Insufficient documentation

## 2022-03-30 DIAGNOSIS — Z8546 Personal history of malignant neoplasm of prostate: Secondary | ICD-10-CM | POA: Insufficient documentation

## 2022-03-30 DIAGNOSIS — R531 Weakness: Secondary | ICD-10-CM | POA: Insufficient documentation

## 2022-03-30 DIAGNOSIS — D075 Carcinoma in situ of prostate: Secondary | ICD-10-CM | POA: Insufficient documentation

## 2022-03-30 DIAGNOSIS — Z7189 Other specified counseling: Secondary | ICD-10-CM | POA: Insufficient documentation

## 2022-03-30 DIAGNOSIS — Z659 Problem related to unspecified psychosocial circumstances: Secondary | ICD-10-CM | POA: Insufficient documentation

## 2022-03-30 DIAGNOSIS — R32 Unspecified urinary incontinence: Secondary | ICD-10-CM | POA: Insufficient documentation

## 2022-03-30 NOTE — Patient Outreach (Signed)
[facility] Care Management ?Telephonic RN Care Manager Note ? ? ?03/30/2022 ?Name:  Walter Goodwin MRN:  [id number] DOB:  July 04, 1917 ? ?Summary: ?Coverage for [facility] NP Deloria Lair ?Returned call to [employer] after message from [place] received  ?Mrs Amrhein reports she had been attempting to outreach since patient's 03/22/22 ED visit but was able to speak with Dr Legrand Rams and his nurse Tonia Ghent to get help with orders for hospice services  ? ? ?Hospice visited this morning 03/30/22 per wife ?She states hospice states the patient is doing "okay" ? ? ?[facility] is active, was to visit today but the staff member is sick  ?He is bedridden now as she reports he has "no use of his legs" and is not able to sit in a w/c "Just sleep all the time now"  ?The wife reports she thinks they are okay now that they have some help with his home care ? ?Wife did not think Mr Lurz should have been sent home from ED She questioned if his had pneumonia  She voiced her concerns about him not being returned home via EMS in the rain with only his son's assistance ? ? ?No nebulizer nor inhaler in there home for wheezing ? ?Previously [facility] nursing stay for a month Mrs Gallant does not want Mr Morea to return to a facility  ?Mr Salceda served in the [employer] during [place] war [employer] ?Assist from son and wife in home and daughter, Remo Lipps who lives across the street ? ?Recommendations/Changes made from today's visit: ?Follow up outreach to wife as requested after briefly reviewing some EPIC notes, 03/22/22 ED notes for wheezing ?Confirmed home treatment/services in place plus outreach to pcp ?Reviewed ED notes, findings, treatment ?Commended wife for her outreach to pcp office for post ED assistance ?Inquired about what hospice stated to her today ?Explain respite care services and encouraged a discussion with [facility] ?Allowed wife to ventilate her feelings ?Encouraged repositioning to prevent  bed/pressure sores plus oral care  ?Left a message at pcp office related to possible assistance with home treatment for wheezing  ? ? ?Subjective: ?Walter Goodwin is an 88 y.o. year old male who is a primary patient of Fanta, Normajean Baxter, MD. The care management team was consulted for assistance with care management and/or care coordination needs.   ? ?Telephonic RN Care Manager completed Telephone Visit today.  ? ?Objective: ? ?Medications Reviewed Today   ? ? Reviewed by Tollie Pizza, CPhT (Pharmacy Technician) on 03/18/22 at 1201  Med List Status: Complete  ? ?Medication Order Taking? Sig Documenting Provider Last Dose Status Informant  ?acetaminophen (TYLENOL) 325 MG tablet [id number] Yes Take 650 mg by mouth every 6 (six) hours as needed for mild pain. [provider] unk Active Pharmacy Records, Child  ?amLODipine (NORVASC) 5 MG tablet [id number] Yes Take 5 mg by mouth daily. [provider] 03/17/2022 Active Pharmacy Records, Child  ?atorvastatin (LIPITOR) 20 MG tablet [id number] Yes Take 10 mg by mouth at bedtime. [provider] 03/17/2022 Active Pharmacy Records, Child  ?docusate sodium (COLACE) 100 MG capsule [id number] Yes Take 100 mg by mouth 2 (two) times daily. [provider] 03/17/2022 Active Pharmacy Records, Child  ?donepezil (ARICEPT) 10 MG tablet [id number] Yes Take 1 tablet (10 mg total) by mouth at bedtime. Gerlene Fee, NP 03/17/2022 Active Pharmacy Records, Child  ?Ensure (ENSURE) [id number] Yes Take 237 mLs by mouth in the morning and at bedtime.  [provider] 03/17/2022 Active Pharmacy Records, Child  ?levothyroxine (SYNTHROID) 175 MCG tablet [id number] Yes Take 1 tablet (175 mcg total) by mouth daily before breakfast. Gerlene Fee, NP 03/18/2022 Active Pharmacy Records, Child  ?mirtazapine (REMERON) 15 MG tablet [id number] Yes Take 1 tablet (15 mg total) by mouth at bedtime. Gerlene Fee, NP 03/17/2022 Active Pharmacy Records, Child   ?potassium chloride SA (KLOR-CON) 20 MEQ tablet [id number] Yes Take 1 tablet (20 mEq total) by mouth every other day.  ?Patient taking differently: Take 20 mEq by mouth daily.  ? Gerlene Fee, NP 03/17/2022 Active Pharmacy Records, Child  ?sertraline (ZOLOFT) 100 MG tablet [id number] Yes Take 1 tablet (100 mg total) by mouth daily.  ?Patient taking differently: Take 100 mg by mouth at bedtime.  ? Gerlene Fee, NP 03/17/2022 Active Pharmacy Records, Child  ?traZODone (DESYREL) 50 MG tablet [id number] Yes Take 75 mg by mouth 2 (two) times daily as needed (restlessness, agitation, or inability to relax). [provider] 03/17/2022 Active Pharmacy Records, Child  ?vitamin B-12 (CYANOCOBALAMIN) 1000 MCG tablet [id number] Yes Take 1,000 mcg by mouth daily. [provider] 03/17/2022 Active Pharmacy Records, Child  ? ?  ?  ? ?  ? ?Patient Active Problem List  ? Diagnosis Date Noted  ? Weakness 03/30/2022  ? Urinary incontinence 03/30/2022  ? Problem related to unspecified psychosocial circumstances 03/30/2022  ? Other specified counseling 03/30/2022  ? History of malignant neoplasm of prostate 03/30/2022  ? 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 Prostate cancer ([place]) 03/30/2022  ? Chronic kidney disease, stage 3b ([place]) 08/31/2020  ? Major depressive disorder 08/31/2020  ? Chronic constipation 08/31/2020  ? Hypothyroid 08/31/2020  ? Bilateral lower extremity edema 08/31/2020  ? UTI (urinary tract infection) 08/24/2020  ? Dementia with behavioral disturbance ([place]) 06/29/2017  ? Anemia 06/29/2017  ? Hyperlipidemia 06/29/2017  ? Essential (primary) hypertension 06/25/2017  ? Intertrochanteric fracture of right femur ([place]) 06/25/2017  ? Knee pain, right 06/25/2017  ? Syncope 06/25/2017  ? ? ? ?SDOH:  (Social Determinants of Health) assessments and interventions performed:  ? ? ?Care Plan ? ?Review of patient past medical history, allergies, medications, health status, including review of consultants reports,  laboratory and other test data, was performed as part of comprehensive evaluation for care management services.  ? ?There are no care plans that you recently modified to display for this patient. ?  ? ?Plan: RN Cm to updated C Spinks on outreach via e-mail for further outreach as needed ?The patient has been provided with contact information for the care management team and has been advised to call with any health related questions or concerns.  ? ?Ivanka Kirshner L. Lavina Hamman, RN, BSN, CCM ?[place] Management Care Coordinator ?Office number [phone] ?[facility] number [phone] ?Fax number [phone] ? ? ? ? ? ?

## 2022-04-04 ENCOUNTER — Other Ambulatory Visit: Payer: Self-pay | Admitting: *Deleted

## 2022-04-04 NOTE — Patient Outreach (Signed)
[facility]) Care Management Geriatric Nurse Practitioner Note   04/04/2022 Name:  Walter Goodwin MRN:  [id number] DOB:  March 11, 1917  Summary: Patient has transitioned to Hospice care with [facility] on 03/30/22 due to general decline, no longer ambultory and decreased po intake.  Recommendations/Changes made from today's visit: Educated on natural death signs, loss of appetite, refusing food and that this is part of approaching end of life. They are a devote family and have not wanted to complete Advanced Directives but after discussing process and that this is God's way to bring them home, Mrs. Burker, was more satisfied with the situation.   Subjective: Walter Goodwin is an 88 y.o. year old male who is a primary patient of Fanta, Wayland Salinas, MD. The care management team was consulted for assistance with care management and/or care coordination needs.    Geriatric Nurse Practitioner completed Telephone Visit today.   Objective:  Medications Reviewed Today     Reviewed by Clinton Gallant, RN (Registered Nurse) on 03/30/22 at 1133  Med List Status: <None>   Medication Order Taking? Sig Documenting Provider Last Dose Status Informant  acetaminophen (TYLENOL) 325 MG tablet [id number] No Take 650 mg by mouth every 6 (six) hours as needed for mild pain. [provider] unk Active Pharmacy Records, Child  amLODipine (NORVASC) 5 MG tablet [id number] No Take 5 mg by mouth daily. [provider] 03/17/2022 Active Pharmacy Records, Child  atorvastatin (LIPITOR) 20 MG tablet [id number] No Take 10 mg by mouth at bedtime. [provider] 03/17/2022 Active Pharmacy Records, Child  docusate sodium (COLACE) 100 MG capsule [id number] No Take 100 mg by mouth 2 (two) times daily. [provider] 03/17/2022 Active Pharmacy Records, Child  donepezil (ARICEPT) 10 MG tablet [id number] No Take 1 tablet (10 mg total) by mouth at bedtime. Sharee Holster,  NP 03/17/2022 Active Pharmacy Records, Child  Ensure [facility]) [id number] No Take 237 mLs by mouth in the morning and at bedtime. [provider] 03/17/2022 Active Pharmacy Records, Child  levothyroxine (SYNTHROID) 175 MCG tablet [id number] No Take 1 tablet (175 mcg total) by mouth daily before breakfast. Sharee Holster, NP 03/18/2022 Active Pharmacy Records, Child  mirtazapine (REMERON) 15 MG tablet [id number] No Take 1 tablet (15 mg total) by mouth at bedtime. Sharee Holster, NP 03/17/2022 Active Pharmacy Records, Child  potassium chloride SA (KLOR-CON) 20 MEQ tablet [id number] No Take 1 tablet (20 mEq total) by mouth every other day.  Patient taking differently: Take 20 mEq by mouth daily.   Sharee Holster, NP 03/17/2022 Active Pharmacy Records, Child  sertraline (ZOLOFT) 100 MG tablet [id number] No Take 1 tablet (100 mg total) by mouth daily.  Patient taking differently: Take 100 mg by mouth at bedtime.   Sharee Holster, NP 03/17/2022 Active Pharmacy Records, Child  traZODone (DESYREL) 50 MG tablet [id number] No Take 75 mg by mouth 2 (two) times daily as needed (restlessness, agitation, or inability to relax). [provider] 03/17/2022 Active Pharmacy Records, Child  vitamin B-12 (CYANOCOBALAMIN) 1000 MCG tablet [id number] No Take 1,000 mcg by mouth daily. [provider] 03/17/2022 Active Pharmacy Records, Child             SDOH:  (Social Determinants of Health) assessments and interventions performed:    Care Plan  Review of patient past medical history, allergies, medications, health status, including review of consultants reports, laboratory and other test data, was performed as part of comprehensive evaluation for care management  services.   Care Plan : RN Care Manager Plan of Care  Updates made by Almetta Lovely, NP since 04/04/2022 12:00 AM     Problem: High Risk for falls and household injuries due to frailty and gait abnormality (uses a walker). Resolved  04/04/2022  Priority: High  Onset Date: 11/11/2021     Long-Range Goal: Pt will not sustain injury requiring ED visit over the next year. Completed 04/04/2022  Start Date: 11/11/2021  Expected End Date: 11/11/2022  This Visit's Progress: On track  Priority: High  Note:   Update 04/04/22   (Status: Goal Met.) Long Term Goal  Evaluation of current treatment plan related to FALLS and patient's adherence to plan as established by provider Mrs. Rankins denies her husband has had any falls, however, his condition has taken a turn for the worse and it appears he is approaching the end of life. He is now bed bound, not walking at all.   Update: 02/09/22  (Status: Goal on Track (progressing): YES.) Long Term Goal  Evaluation of current status: No falls or household injuries. Family is very vigilent to ensure pt is safe, using walker and accompanying him on trips to bathroom and to go to bed. He does not walk around without someone right there.  Current Barriers:  Cognitive Deficits Family members level of understand of fall prevention  RNCM Clinical Goal(s):  Patient will  through collaboration with RN Care manager, provider, and care team.   Interventions: Inter-disciplinary care team collaboration (see longitudinal plan of care) Evaluation of current treatment plan related to  self management and patient's adherence to plan as established by provider  Patient Goals/Self-Care Activities: Pt will avoid injury over the next year as evidenced by family report each quarter. Pt will use walker at all times when ambulating . Family to supervise and ensure pt uses walker and give additional assist when needed to avoid falls.       Problem: Poor appetite Resolved 04/04/2022  Priority: Medium  Onset Date: 11/11/2021     Long-Range Goal: Patient weight will not drop below 180# over the next year per chart review. Completed 04/04/2022  Start Date: 11/11/2021  Expected End Date: 11/11/2022  This Visit's  Progress: On track  Priority: Medium  Note:    Update 04/04/22:  (Status: Goal Met.) Short Term Goal  Evaluation of current treatment plan related to Weight  and patient's adherence to plan as established by provider No weight loss according to ER wt on record, however pt has decreased intake and is now on Hospice server. Mrs. Gennuso and NP discussed as one approaches the end of life the need to eat or feel under is diminished and the individual is not uncomfortable. This is the natural dying process which they prefer what God wants. After some counseling she does agree that this makes since. Informed [facility] of this intervention and that [facility] is closing pt case to transition completely to their servcies.  Update 02/09/22:  (Status: Goal on Track (progressing): YES.) Long Term Goal  Pt current weight 211# (He went to the ED in January due to difficulty awakening him and was weighed there). He is not safe to weigh at home. Wife reports his appetite remains pretty good and his clothes fit has not changed.  They offer meals, snacks, ensure/boost regularly. Encouraged them to continue their great care for Mr. Koda.  Current Barriers:  Cognitive Deficits  RNCM Clinical Goal(s):  Patient will  be monitored and wt  judged by family report of appetite and the way his clothes are fitting (He is not able to weigh safely)  through collaboration with RN Care manager, provider, and care team.   Interventions: Inter-disciplinary care team collaboration (see longitudinal plan of care) Evaluation of current treatment plan related to  self management and patient's adherence to plan as established by provider  Patient Goals/Self-Care Activities: Pt will maintain wt >180 over the next year as evidenced by verbal report of family regarding his appetite and the way his clothes are fitting. Call provider office for new concerns or questions  regarding decline in appetite and general  well-being. Encourage 1-2 cans of ensure/day if pt is refusing a meal.        Plan: No further follow up required pt has transitioned to Hospice Care. NP to close his case.  Zara Council. Burgess Estelle, MSN, [facility] Gerontological Nurse Practitioner [facility] Care Management [phone]

## 2022-05-12 ENCOUNTER — Other Ambulatory Visit: Payer: Self-pay | Admitting: *Deleted

## 2022-05-12 NOTE — Patient Outreach (Signed)
Walter Goodwin) Care Management  05/12/2022  Walter Goodwin Apr 29, 1917 [id number]  Walter Goodwin was on NP schedule today, although his case was closed in April as he now has Hospice services. NP called just to check in. Talked with Walter Goodwin who reports Walter Goodwin has actually improved and the Hospice service is wonderful! She is grateful for all their care and support. Encouraged her and advised that prayers are being said for both of them.  [name]. [name], MSN, [facility] Gerontological Nurse Practitioner [facility] Care Management [phone]

## 2022-10-17 ENCOUNTER — Encounter: Payer: Self-pay | Admitting: *Deleted

## 2022-10-17 ENCOUNTER — Ambulatory Visit: Payer: Self-pay | Admitting: *Deleted

## 2022-10-17 NOTE — Patient Outreach (Signed)
  Care Coordination   Initial Visit Note   10/17/2022  Name: Walter Goodwin MRN: [id number] DOB: 1917/12/02  Walter Goodwin is a 88 y.o. year old male who sees Walter Goodwin, Walter Baxter, MD for primary care. I spoke with patient's wife, Walter Goodwin by phone today.  What matters to the patients health and wellness today?  No Interventions Identified.  SDOH assessments and interventions completed:  Yes.  SDOH Interventions Today    Flowsheet Row Most Recent Value  SDOH Interventions   Food Insecurity Interventions Intervention Not Indicated, Other (Comment)  [Verified by Wife - [place] Interventions Other (Comment), Intervention Not Indicated  [Verified by Wife - Marjorie Norris]  Transportation Interventions Intervention Not Indicated, Other (Comment)  [Verified by Wife - Pleas Koch Kracht]  Utilities Interventions Intervention Not Indicated, Other (Comment)  [Verified by Wife - Marjorie Lesiak]  Alcohol Usage Interventions Intervention Not Indicated (Score <7), Other (Comment)  [Verified by Wife - Marjorie Yniguez]  Financial Strain Interventions Intervention Not Indicated, Other (Comment)  [Verified by Wife - Pleas Koch Moccio]  Physical Activity Interventions Patient Refused, Other (Comments)  [Verified by Wife - Pleas Koch Bruhn]  Stress Interventions Intervention Not Indicated, Other (Comment)  [Verified by Wife - Marjorie Delfino]  Social Connections Interventions Intervention Not Indicated, Other (Comment)  [Verified by Wife - [place] Coordination Interventions Activated:  Yes.   Care Coordination Interventions:  Yes, provided.   Follow up plan: No further intervention required.   Encounter Outcome:  Pt. Visit Completed.   Nat Christen, BSW, MSW, LCSW  Licensed Education officer, [facility]  Mailing [place] [address], [place], [place] [address] Physical Address-[address] [place], [place] [address] Toll Free Main # [phone] Fax # [phone] Cell # [phone] [email or web address]

## 2022-10-17 NOTE — Patient Instructions (Signed)
Visit Information  Thank you for taking time to visit with me today. Please don't hesitate to contact me if I can be of assistance to you.   Please call the care guide team at [phone] if you need to cancel or reschedule your appointment.   If you are experiencing a Mental Health or Behavioral Health Crisis or need someone to talk to, please call the Suicide and Crisis Lifeline: 988 call the [place] National Suicide Prevention Lifeline: 1-800-273-8255 or TTY: 1-800-799-4 TTY (1-800-799-4889) to talk to a trained counselor call 1-800-273-TALK (toll free, 24 hour hotline) go to [facility] [address], [place] ([phone]) call the [place] Crisis Line: 800-939-9988 call 911  Patient verbalizes understanding of instructions and care plan provided today and agrees to view in MyChart. Active MyChart status and patient understanding of how to access instructions and care plan via MyChart confirmed with patient.     No further follow up required.  Ayako Tapanes, BSW, MSW, LCSW  Licensed Clinical Social Worker  [facility] Care Management [facility]  Mailing Address-[address], [place], [place] [address] Physical Address-[address], [place], [place] [address] Toll Free Main # [phone] Fax # [phone] Cell # [phone] [email or web address]            

## 2023-03-13 DEATH — deceased
